# Patient Record
Sex: Male | Born: 1970 | Race: White | Hispanic: No | Marital: Single | Smoking: Never smoker
Health system: Southern US, Community
[De-identification: ages and names within clinical notes are randomized; demographics above are authoritative.]

## PROBLEM LIST (undated history)

## (undated) DIAGNOSIS — F329 Major depressive disorder, single episode, unspecified: Secondary | ICD-10-CM

## (undated) DIAGNOSIS — F419 Anxiety disorder, unspecified: Secondary | ICD-10-CM

## (undated) DIAGNOSIS — F32A Depression, unspecified: Secondary | ICD-10-CM

## (undated) DIAGNOSIS — F909 Attention-deficit hyperactivity disorder, unspecified type: Secondary | ICD-10-CM

---

## 2016-02-21 ENCOUNTER — Emergency Department (HOSPITAL_COMMUNITY)
Admission: EM | Admit: 2016-02-21 | Discharge: 2016-02-21 | Disposition: A | Discharge status: Home / Self Care | Payer: Medicaid Other | Attending: Emergency Medicine | Admitting: Emergency Medicine

## 2016-02-21 ENCOUNTER — Encounter (HOSPITAL_COMMUNITY): Payer: Self-pay | Admitting: Emergency Medicine

## 2016-02-21 DIAGNOSIS — L709 Acne, unspecified: Secondary | ICD-10-CM

## 2016-02-21 DIAGNOSIS — F909 Attention-deficit hyperactivity disorder, unspecified type: Secondary | ICD-10-CM | POA: Insufficient documentation

## 2016-02-21 HISTORY — DX: Anxiety disorder, unspecified: F41.9

## 2016-02-21 HISTORY — DX: Major depressive disorder, single episode, unspecified: F32.9

## 2016-02-21 HISTORY — DX: Depression, unspecified: F32.A

## 2016-02-21 HISTORY — DX: Attention-deficit hyperactivity disorder, unspecified type: F90.9

## 2016-02-21 MED ORDER — DOXYCYCLINE HYCLATE 100 MG PO CAPS: 100.0000 mg | ORAL_CAPSULE | Freq: Two times a day (BID) | ORAL | 0 refills | Status: DC

## 2016-02-21 NOTE — ED Triage Notes (Signed)
Pt here with multiple small abscess areas to face; pt sts hx of same with staph infection

## 2016-02-21 NOTE — Discharge Instructions (Signed)
Take the prescribed medication as directed. Continue keeping your face clean with warm water and gentle soap. Follow-up with your primary care doctor next month as scheduled. Return to the ED for new or worsening symptoms.

## 2016-02-21 NOTE — ED Provider Notes (Signed)
[facility]-EMERGENCY DEPT Provider Note   CSN: [id number] Arrival date & time: 02/21/16  1524  By signing my name below, I, [name], attest that this documentation has been prepared under the direction and in the presence of Sharilyn SitesLisa Avielle Imbert, PA-C. Electronically signed by: [name], ED Scribe. 02/21/16. 5:12 PM.    History   Chief Complaint Chief Complaint  Patient presents with  . Abscess    The history is provided by the patient and medical records. No language interpreter was used.   HPI Comments:  Tyler Trevino is a 45 y.o. male with a history of staph infection who presents to the Emergency Department complaining of multiple abscesses/acne, which started a weeks ago. Abscesses/acne are localized to face and back of neck.  Pt believes that it may be a staph infection again and has previously taken antibiotics, which have helped. Denies fever.  No recent sick contacts.  No hx of MRSA, HIV, or DM.  Has been using acne medication at home without relief.   Past Medical History:  Diagnosis Date  . ADHD (attention deficit hyperactivity disorder)   . Anxiety   . Depression     There are no active problems to display for this patient.   History reviewed. No pertinent surgical history.     Home Medications    Prior to Admission medications   Not on File    Family History History reviewed. No pertinent family history.  Social History Social History  Substance Use Topics  . Smoking status: Never Smoker  . Smokeless tobacco: Never Used  . Alcohol use Yes     Comment: occ     Allergies   Review of patient's allergies indicates no known allergies.   Review of Systems Review of Systems  Constitutional: Negative for fever.  Skin: Positive for wound (acne).  All other systems reviewed and are negative.    Physical Exam Updated Vital Signs BP 118/86 (BP Location: Right Arm)   Pulse 67   Temp 98 F (36.7 C) (Oral)   Resp 18   SpO2 100%   Physical Exam    Constitutional: He is oriented to person, place, and time. He appears well-developed and well-nourished.  HENT:  Head: Normocephalic and atraumatic.  Mouth/Throat: Oropharynx is clear and moist.  Scattered areas of acne across the face and posterior neck; some areas have small pustules present without active drainage; no surrounding erythema or induration; no facial or neck swelling  Eyes: Conjunctivae and EOM are normal. Pupils are equal, round, and reactive to light.  Neck: Normal range of motion.  Cardiovascular: Normal rate, regular rhythm and normal heart sounds.   Pulmonary/Chest: Effort normal and breath sounds normal.  Abdominal: Soft. Bowel sounds are normal.  Musculoskeletal: Normal range of motion.  Lymphadenopathy:    He has no cervical adenopathy.  Neurological: He is alert and oriented to person, place, and time.  Skin: Skin is warm and dry.  Psychiatric: He has a normal mood and affect.  Nursing note and vitals reviewed.    ED Treatments / Results  DIAGNOSTIC STUDIES:  Oxygen Saturation is 100% on room air, normal by my interpretation.    COORDINATION OF CARE:  5:03 PM Discussed treatment plan with pt at bedside, which includes prescription for antibiotics, and pt agreed to plan. Advised to follow up with PCP if sxs do not improve.  Labs (all labs ordered are listed, but only abnormal results are displayed) Labs Reviewed - No data to display  EKG  EKG Interpretation None       Radiology No results found.  Procedures Procedures (including critical care time)  Medications Ordered in ED Medications - No data to display   Initial Impression / Assessment and Plan / ED Course  I have reviewed the triage vital signs and the nursing notes.  Pertinent labs & imaging results that were available during my care of the patient were reviewed by me and considered in my medical decision making (see chart for details).  Clinical Course   45 year old male here  with concern of staph infection. Patient has large amount of acne scattered across his face and posterior neck. Some areas to have small pustules without active drainage. There are no signs of superimposed infection or cellulitis. Does report history of staph infection. No history of MRSA, HIV, or diabetes. Has been using home acne medication without relief. He is afebrile and nontoxic. Will start on a trial of doxycycline. He has scheduled follow-up with his primary care doctor next month for recheck.  Discussed plan with patient, he acknowledged understanding and agreed with plan of care.  Return precautions given for new or worsening symptoms.  Final Clinical Impressions(s) / ED Diagnoses   Final diagnoses:  Acne, unspecified acne type    New Prescriptions New Prescriptions   DOXYCYCLINE (VIBRAMYCIN) 100 MG CAPSULE    Take 1 capsule (100 mg total) by mouth 2 (two) times daily.   I personally performed the services described in this documentation, which was scribed in my presence. The recorded information has been reviewed and is accurate.    Garlon HatchetLisa M Tayson Schnelle, PA-C 02/21/16 1728    Derwood KaplanAnkit Nanavati, MD 02/22/16 [id number]

## 2016-02-21 NOTE — ED Notes (Signed)
Declined W/C at D/C and was escorted to lobby by RN. 

## 2016-03-13 ENCOUNTER — Encounter (HOSPITAL_COMMUNITY): Payer: Self-pay | Admitting: Emergency Medicine

## 2016-03-13 ENCOUNTER — Emergency Department (HOSPITAL_COMMUNITY)
Admission: EM | Admit: 2016-03-13 | Discharge: 2016-03-13 | Disposition: A | Discharge status: Home / Self Care | Payer: Medicaid Other | Attending: Emergency Medicine | Admitting: Emergency Medicine

## 2016-03-13 DIAGNOSIS — R21 Rash and other nonspecific skin eruption: Secondary | ICD-10-CM | POA: Diagnosis present

## 2016-03-13 DIAGNOSIS — L700 Acne vulgaris: Secondary | ICD-10-CM | POA: Diagnosis not present

## 2016-03-13 DIAGNOSIS — F909 Attention-deficit hyperactivity disorder, unspecified type: Secondary | ICD-10-CM | POA: Insufficient documentation

## 2016-03-13 MED ORDER — DOXYCYCLINE HYCLATE 100 MG PO CAPS: 100.0000 mg | ORAL_CAPSULE | Freq: Two times a day (BID) | ORAL | 0 refills | Status: DC

## 2016-03-13 NOTE — ED Provider Notes (Signed)
[facility]-EMERGENCY DEPT Provider Note   CSN: [id number] Arrival date & time: 03/13/16  1446  By signing my name below, I, Clovis Pu, attest that this documentation has been prepared under the direction and in the presence of  Fayrene Helper, PA-C. Electronically Signed: Clovis Pu, ED Scribe. 03/13/16. 5:06 PM.   History   Chief Complaint Chief Complaint  Patient presents with  . Rash    The history is provided by the patient. No language interpreter was used.   HPI Comments:  Tyler Trevino is a 45 y.o. male who presents to the Emergency Department complaining of intermittent episodes of acne-like rash appearing on his face for the last few years. Pt states the pustules would pop and become infected. He visited the ED for similar symptoms on 02/21/16 and was given antibiotics. Pt notes the antibiotics gave him relief but the symptoms have recently returned. Pt has washed his sheets and disinfected his house but this has provided no relief to his symptoms. He notes an recent elevation in stress levels. He denies fevers and hx of HIV. Pt has not taken drugs recently. DOes have hx of facial acne in the past and in his youth.    Past Medical History:  Diagnosis Date  . ADHD (attention deficit hyperactivity disorder)   . Anxiety   . Depression     There are no active problems to display for this patient.   History reviewed. No pertinent surgical history.     Home Medications    Prior to Admission medications   Medication Sig Start Date End Date Taking? Authorizing Provider  doxycycline (VIBRAMYCIN) 100 MG capsule Take 1 capsule (100 mg total) by mouth 2 (two) times daily. 02/21/16   Garlon Hatchet, PA-C    Family History History reviewed. No pertinent family history.  Social History Social History  Substance Use Topics  . Smoking status: Never Smoker  . Smokeless tobacco: Never Used  . Alcohol use Yes     Comment: occ     Allergies   Review of patient's allergies  indicates no known allergies.   Review of Systems Review of Systems  Constitutional: Negative for fever.  Skin: Positive for rash.     Physical Exam Updated Vital Signs BP 111/87 (BP Location: Left Arm)   Pulse 72   Temp 97.9 F (36.6 C) (Oral)   Resp 18   SpO2 100%   Physical Exam  Constitutional: He appears well-developed and well-nourished. No distress.  HENT:  Head: Normocephalic and atraumatic.  Neck: Neck supple.  Pulmonary/Chest: Effort normal.  Neurological: He is alert.  Skin: Rash noted. He is not diaphoretic. There is erythema.  Multiple papillar pustule lesions consitent to acne. No evidence of cellulitis. No obvious abscess noted. No oral involvement.    Nursing note and vitals reviewed.    ED Treatments / Results  DIAGNOSTIC STUDIES:  Oxygen Saturation is 100% on RA, normal by my interpretation.    COORDINATION OF CARE:  4:55 PM Discussed treatment plan with pt at bedside and pt agreed to plan.  Labs (all labs ordered are listed, but only abnormal results are displayed) Labs Reviewed - No data to display  EKG  EKG Interpretation None       Radiology No results found.  Procedures Procedures (including critical care time)  Medications Ordered in ED Medications - No data to display   Initial Impression / Assessment and Plan / ED Course  I have reviewed the triage vital signs and the  nursing notes.  Pertinent labs & imaging results that were available during my care of the patient were reviewed by me and considered in my medical decision making (see chart for details).  Clinical Course   BP 111/87 (BP Location: Left Arm)   Pulse 72   Temp 97.9 F (36.6 C) (Oral)   Resp 18   SpO2 100%    Final Clinical Impressions(s) / ED Diagnoses   Final diagnoses:  Pustular acne    New Prescriptions Current Discharge Medication List    I personally performed the services described in this documentation, which was scribed in my presence.  The recorded information has been reviewed and is accurate.   5:09 PM Pt here with recurrent facial rash consistent with pustular acne.  Will prescribe doxy as well as dermatology referral.      Fayrene HelperBowie Adwoa Axe, PA-C 03/13/16 1710    Bethann BerkshireJoseph Zammit, MD 03/13/16 2319

## 2016-03-13 NOTE — ED Triage Notes (Signed)
Pt states that he has had acne-appearing rash on his face off and on for years that has worsened recently. Hx of staph infection. Cleared up with antibiotics before but has returned. Alert and oriented.

## 2016-03-27 ENCOUNTER — Ambulatory Visit: Payer: Self-pay | Admitting: Family Medicine

## 2016-05-12 ENCOUNTER — Emergency Department (HOSPITAL_COMMUNITY): Payer: Medicaid Other

## 2016-05-12 ENCOUNTER — Emergency Department (HOSPITAL_COMMUNITY)
Admission: EM | Admit: 2016-05-12 | Discharge: 2016-05-12 | Disposition: A | Discharge status: Home / Self Care | Payer: Medicaid Other | Attending: Emergency Medicine | Admitting: Emergency Medicine

## 2016-05-12 ENCOUNTER — Encounter (HOSPITAL_COMMUNITY): Payer: Self-pay | Admitting: Emergency Medicine

## 2016-05-12 DIAGNOSIS — Y929 Unspecified place or not applicable: Secondary | ICD-10-CM | POA: Insufficient documentation

## 2016-05-12 DIAGNOSIS — Y999 Unspecified external cause status: Secondary | ICD-10-CM | POA: Insufficient documentation

## 2016-05-12 DIAGNOSIS — F909 Attention-deficit hyperactivity disorder, unspecified type: Secondary | ICD-10-CM | POA: Insufficient documentation

## 2016-05-12 DIAGNOSIS — Y939 Activity, unspecified: Secondary | ICD-10-CM | POA: Diagnosis not present

## 2016-05-12 DIAGNOSIS — S59902A Unspecified injury of left elbow, initial encounter: Secondary | ICD-10-CM | POA: Diagnosis present

## 2016-05-12 DIAGNOSIS — S5002XA Contusion of left elbow, initial encounter: Secondary | ICD-10-CM | POA: Diagnosis not present

## 2016-05-12 DIAGNOSIS — W010XXA Fall on same level from slipping, tripping and stumbling without subsequent striking against object, initial encounter: Secondary | ICD-10-CM | POA: Diagnosis not present

## 2016-05-12 MED ORDER — NAPROXEN 500 MG PO TABS: 500.0000 mg | ORAL_TABLET | Freq: Two times a day (BID) | ORAL | 0 refills | Status: DC

## 2016-05-12 NOTE — ED Provider Notes (Signed)
[facility]-EMERGENCY DEPT Provider Note   CSN: [id number] Arrival date & time: 05/12/16  1258   By signing my name below, I, Avnee Patel, attest that this documentation has been prepared under the direction and in the presence of  Cheri FowlerKayla Kenyada Dosch, PA-C. Electronically Signed: Clovis PuAvnee Patel, ED Scribe. 05/12/16. 3:29 PM.   History   Chief Complaint Chief Complaint  Patient presents with  . Arm Injury   The history is provided by the patient. No language interpreter was used.   HPI Comments:  Tyler Trevino is a 45 y.o. male who presents to the Emergency Department complaining of gradual onset, moderate left elbow pain s/p mechanical fall which occurred last night. Pt states he tripped and fell onto his elbow and notes he is not able to fully move arm at this joint. He denies shoulder pain, wrist pain and any other symptoms at this time. No head injury or LOC.  No alleviating factors noted.   Past Medical History:  Diagnosis Date  . ADHD (attention deficit hyperactivity disorder)   . Anxiety   . Depression     There are no active problems to display for this patient.   No past surgical history on file.  Home Medications    Prior to Admission medications   Medication Sig Start Date End Date Taking? Authorizing Provider  doxycycline (VIBRAMYCIN) 100 MG capsule Take 1 capsule (100 mg total) by mouth 2 (two) times daily. 03/13/16   Fayrene HelperBowie Tran, PA-C  naproxen (NAPROSYN) 500 MG tablet Take 1 tablet (500 mg total) by mouth 2 (two) times daily. 05/12/16   Cheri FowlerKayla Mila Pair, PA-C    Family History No family history on file.  Social History Social History  Substance Use Topics  . Smoking status: Never Smoker  . Smokeless tobacco: Never Used  . Alcohol use Yes     Comment: occ     Allergies   Patient has no known allergies.   Review of Systems Review of Systems  Musculoskeletal: Positive for arthralgias.  Neurological: Negative for numbness.  All other systems reviewed and are  negative.   Physical Exam Updated Vital Signs BP 128/85 (BP Location: Right Arm)   Pulse 100   Temp 98.5 F (36.9 C) (Oral)   Resp 16   SpO2 100%   Physical Exam  Constitutional: He is oriented to person, place, and time. He appears well-developed and well-nourished. No distress.  HENT:  Head: Normocephalic and atraumatic.  Eyes: Conjunctivae are normal.  Cardiovascular: Normal rate.   Pulses:      Radial pulses are 2+ on the right side, and 2+ on the left side.  Pulmonary/Chest: Effort normal.  Abdominal: He exhibits no distension.  Musculoskeletal: He exhibits tenderness.  Left elbow without swelling, bruising, or wound. Normal ROM with flexion and extension. Tenderness over olecranon. Compartment soft and compressible.  Neurological: He is alert and oriented to person, place, and time.  Strength and sensations intact throughout BUE.   Skin: Skin is warm and dry.  Psychiatric: He has a normal mood and affect.  Nursing note and vitals reviewed.   ED Treatments / Results  DIAGNOSTIC STUDIES:  Oxygen Saturation is 100% on RA, normal by my interpretation.    COORDINATION OF CARE:  3:24 PM Discussed treatment plan with pt at bedside and pt agreed to plan.  Labs (all labs ordered are listed, but only abnormal results are displayed) Labs Reviewed - No data to display  EKG  EKG Interpretation None  Radiology Dg Elbow Complete Left  Result Date: 05/12/2016 CLINICAL DATA:  Pain after trauma EXAM: LEFT ELBOW - COMPLETE 3+ VIEW COMPARISON:  None. FINDINGS: There is no evidence of fracture, dislocation, or joint effusion. There is no evidence of arthropathy or other focal bone abnormality. Soft tissues are unremarkable. IMPRESSION: Negative. Electronically Signed   By: Gerome Samavid  Williams III M.D   On: 05/12/2016 13:48    Procedures Procedures (including critical care time)  Medications Ordered in ED Medications - No data to display   Initial Impression /  Assessment and Plan / ED Course  I have reviewed the triage vital signs and the nursing notes.  Pertinent labs & imaging results that were available during my care of the patient were reviewed by me and considered in my medical decision making (see chart for details).  Clinical Course    Patient X-Ray negative for obvious fracture or dislocation.  Pt advised to follow up with orthopedics if pain persists. Conservative therapy recommended and discussed. Patient will be discharged home & is agreeable with above plan. Returns precautions discussed. Pt appears safe for discharge.  Final Clinical Impressions(s) / ED Diagnoses   Final diagnoses:  Contusion of left elbow, initial encounter    New Prescriptions Discharge Medication List as of 05/12/2016  3:26 PM    START taking these medications   Details  naproxen (NAPROSYN) 500 MG tablet Take 1 tablet (500 mg total) by mouth 2 (two) times daily., Starting Sat 05/12/2016, Print       I personally performed the services described in this documentation, which was scribed in my presence. The recorded information has been reviewed and is accurate.     Cheri FowlerKayla Iyla Balzarini, PA-C 05/12/16 2200    Melene Planan Floyd, DO 05/13/16 1242

## 2016-05-12 NOTE — Discharge Instructions (Signed)
Start taking Naprosyn twice daily as needed for pain. Apply ice to her elbow for 10-20 minutes 3 times daily. Continue performing range of motion exercises to avoid stiffness. Follow-up with orthopedics in 1-2 weeks for persistent symptoms. Return to the emergency department for sudden severe swelling, numbness, changes in color, or any new or concerning symptoms.

## 2016-05-12 NOTE — ED Triage Notes (Signed)
Per pt, states he tripped and fell onto left elbow last night-now unable to fully move left arm at elbow

## 2017-01-10 ENCOUNTER — Emergency Department (HOSPITAL_COMMUNITY)
Admission: EM | Admit: 2017-01-10 | Discharge: 2017-01-10 | Disposition: A | Discharge status: Home / Self Care | Payer: Medicaid Other | Attending: Emergency Medicine | Admitting: Emergency Medicine

## 2017-01-10 ENCOUNTER — Emergency Department (HOSPITAL_COMMUNITY): Payer: Medicaid Other

## 2017-01-10 ENCOUNTER — Encounter (HOSPITAL_COMMUNITY): Payer: Self-pay | Admitting: Emergency Medicine

## 2017-01-10 DIAGNOSIS — R0789 Other chest pain: Secondary | ICD-10-CM | POA: Insufficient documentation

## 2017-01-10 DIAGNOSIS — R1031 Right lower quadrant pain: Secondary | ICD-10-CM | POA: Diagnosis present

## 2017-01-10 DIAGNOSIS — R109 Unspecified abdominal pain: Secondary | ICD-10-CM

## 2017-01-10 LAB — I-STAT TROPONIN, ED: TROPONIN I, POC: 0.01 ng/mL (ref 0.00–0.08)

## 2017-01-10 LAB — CBC WITH DIFFERENTIAL/PLATELET
BASOS ABS: 0 10*3/uL (ref 0.0–0.1)
BASOS PCT: 0 %
EOS ABS: 0.1 10*3/uL (ref 0.0–0.7)
EOS PCT: 3 %
HCT: 40.7 % (ref 39.0–52.0)
Hemoglobin: 14.4 g/dL (ref 13.0–17.0)
Lymphocytes Relative: 25 %
Lymphs Abs: 1.1 10*3/uL (ref 0.7–4.0)
MCH: 31.5 pg (ref 26.0–34.0)
MCHC: 35.4 g/dL (ref 30.0–36.0)
MCV: 89.1 fL (ref 78.0–100.0)
MONO ABS: 0.5 10*3/uL (ref 0.1–1.0)
Monocytes Relative: 10 %
Neutro Abs: 2.8 10*3/uL (ref 1.7–7.7)
Neutrophils Relative %: 62 %
Platelets: 321 10*3/uL (ref 150–400)
RBC: 4.57 MIL/uL (ref 4.22–5.81)
RDW: 13.2 % (ref 11.5–15.5)
WBC: 4.4 10*3/uL (ref 4.0–10.5)

## 2017-01-10 LAB — COMPREHENSIVE METABOLIC PANEL
ALK PHOS: 52 U/L (ref 38–126)
ALT: 8 U/L — ABNORMAL LOW (ref 17–63)
ANION GAP: 8 (ref 5–15)
AST: 21 U/L (ref 15–41)
Albumin: 4.2 g/dL (ref 3.5–5.0)
BILIRUBIN TOTAL: 0.8 mg/dL (ref 0.3–1.2)
BUN: 10 mg/dL (ref 6–20)
CALCIUM: 9.5 mg/dL (ref 8.9–10.3)
CO2: 30 mmol/L (ref 22–32)
Chloride: 99 mmol/L — ABNORMAL LOW (ref 101–111)
Creatinine, Ser: 0.72 mg/dL (ref 0.61–1.24)
GFR calc non Af Amer: >60 mL/min (ref >60)
GLUCOSE: 98 mg/dL (ref 65–99)
Potassium: 3.8 mmol/L (ref 3.5–5.1)
Sodium: 137 mmol/L (ref 135–145)
TOTAL PROTEIN: 7.4 g/dL (ref 6.5–8.1)

## 2017-01-10 LAB — URINALYSIS, ROUTINE W REFLEX MICROSCOPIC
Bilirubin Urine: NEGATIVE
GLUCOSE, UA: NEGATIVE mg/dL
HGB URINE DIPSTICK: NEGATIVE
Ketones, ur: NEGATIVE mg/dL
Leukocytes, UA: NEGATIVE
Nitrite: NEGATIVE
PH: 8 (ref 5.0–8.0)
PROTEIN: NEGATIVE mg/dL
Specific Gravity, Urine: 1.003 — ABNORMAL LOW (ref 1.005–1.030)

## 2017-01-10 LAB — ETHANOL: Alcohol, Ethyl (B): <5 mg/dL (ref <5)

## 2017-01-10 LAB — LIPASE, BLOOD: Lipase: 16 U/L (ref 11–51)

## 2017-01-10 NOTE — ED Provider Notes (Signed)
[facility]-EMER[facility] DEPT Provider Note   CSN: [id number] Arrival date & time: 01/10/17  1826     History   Chief Complaint Chief Complaint  Patient presents with  . Abdominal Pain    HPI Tyler Trevino is a 46 y.o. male.  The history is provided by the patient and medical records.  Abdominal Pain      46 year old male with history of ADHD, anxiety, depression, presenting to the ED with abdominal pain.  Patient reports this afternoon he had sudden onset of right flank pain. States pain is sharp and stabbing. States he feels like something inside is going to burst. Reports he has had frequent urination, but does drink large amounts of water. He has not noticed any hematuria. No nausea or vomiting. No fever or chills.  Patient does admit he has been drinking heavily for the past week. He has been drinking partially 12 beers nightly, last drink was around 10:30 PM yesterday.  States he does not feel he has a problem with alcohol, however he has been vacationing with some friends and when return home he was having trouble dealing with some new changes in his life and was using alcohol to cope.  Reports he occasionally smokes marijuana, none recently.  States he has had a little bit of chest tightness but thinks it is due to his anxiety. States he gets very concerned and overwhelmed in regards to his health. No SOB, cough.   He has no known cardiac history. Reports his grandfather died from cardiac complications as well as Doreatha Martin disease. He is not a cigarette smoker.  Past Medical History:  Diagnosis Date  . ADHD (attention deficit hyperactivity disorder)   . Anxiety   . Depression     There are no active problems to display for this patient.   History reviewed. No pertinent surgical history.     Home Medications    Prior to Admission medications   Medication Sig Start Date End Date Taking? Authorizing Provider  doxycycline (VIBRAMYCIN) 100 MG capsule Take 1 capsule (100 mg  total) by mouth 2 (two) times daily. 03/13/16   Fayrene Helper, PA-C  naproxen (NAPROSYN) 500 MG tablet Take 1 tablet (500 mg total) by mouth 2 (two) times daily. 05/12/16   Cheri Fowler, PA-C    Family History History reviewed. No pertinent family history.  Social History Social History  Substance Use Topics  . Smoking status: Never Smoker  . Smokeless tobacco: Never Used  . Alcohol use Yes     Comment: occ     Allergies   Patient has no known allergies.   Review of Systems Review of Systems  Respiratory: Positive for chest tightness.   Gastrointestinal: Positive for abdominal pain.  All other systems reviewed and are negative.    Physical Exam Updated Vital Signs BP (!) 118/95 (BP Location: Right Arm)   Pulse 86   Temp 97.7 F (36.5 C) (Oral)   Resp 18   SpO2 100%   Physical Exam  Constitutional: He is oriented to person, place, and time. He appears well-developed and well-nourished.  HENT:  Head: Normocephalic and atraumatic.  Mouth/Throat: Oropharynx is clear and moist.  Eyes: Pupils are equal, round, and reactive to light. Conjunctivae and EOM are normal.  Neck: Normal range of motion.  Cardiovascular: Normal rate, regular rhythm and normal heart sounds.   Pulmonary/Chest: Effort normal and breath sounds normal.  Abdominal: Soft. Bowel sounds are normal.  Musculoskeletal: Normal range of motion.  Neurological: He  is alert and oriented to person, place, and time.  Skin: Skin is warm and dry.  Psychiatric: His mood appears anxious.  Somewhat anxious appearing, limited eye contact, rapid speech  Nursing note and vitals reviewed.    ED Treatments / Results  Labs (all labs ordered are listed, but only abnormal results are displayed) Labs Reviewed  COMPREHENSIVE METABOLIC PANEL - Abnormal; Notable for the following:       Result Value   Chloride 99 (*)    ALT 8 (*)    All other components within normal limits  URINALYSIS, ROUTINE W REFLEX MICROSCOPIC -  Abnormal; Notable for the following:    Color, Urine STRAW (*)    Specific Gravity, Urine 1.003 (*)    All other components within normal limits  CBC WITH DIFFERENTIAL/PLATELET  LIPASE, BLOOD  ETHANOL  I-STAT TROPOININ, ED    EKG  EKG Interpretation None       Radiology Dg Chest 2 View  Result Date: 01/10/2017 CLINICAL DATA:  Chest tightness, anxiety. EXAM: CHEST  2 VIEW COMPARISON:  None. FINDINGS: Cardiomediastinal silhouette is normal. No pleural effusions or focal consolidations. Hyperinflation with flattened hemidiaphragms. Trachea projects midline and there is no pneumothorax. Soft tissue planes and included osseous structures are non-suspicious. IMPRESSION: Hyperinflation without focal consolidation. Electronically Signed   By: Awilda Metroourtnay  Bloomer M.D.   On: 01/10/2017 20:37   Ct Renal Stone Study  Result Date: 01/10/2017 CLINICAL DATA:  Acute onset right lower quadrant pain today. EXAM: CT ABDOMEN AND PELVIS WITHOUT CONTRAST TECHNIQUE: Multidetector CT imaging of the abdomen and pelvis was performed following the standard protocol without IV contrast. COMPARISON:  None. FINDINGS: Lower chest: No acute findings. Hepatobiliary: No masses visualized on this unenhanced exam. Gallbladder is unremarkable. Pancreas: No mass or inflammatory process visualized on this unenhanced exam. Spleen:  Within normal limits in size. Adrenals/Urinary tract: No evidence of urolithiasis or hydronephrosis. Mild diffuse bladder wall thickening, suspicious for cystitis. Stomach/Bowel: No evidence of obstruction, inflammatory process, or abnormal fluid collections. Mild colonic diverticulosis, without radiographic evidence of diverticulitis. Vascular/Lymphatic: No pathologically enlarged lymph nodes identified. No evidence of abdominal aortic aneurysm. Reproductive:  No mass or other significant abnormality. Other:  None. Musculoskeletal:  No suspicious bone lesions identified. IMPRESSION: No evidence of  urolithiasis or hydronephrosis. Mild diffuse bladder wall thickening, suspicious for cystitis. Recommend correlation with urinalysis, and consider cystoscopy for further evaluation if warranted. Colonic diverticulosis, without radiographic evidence of diverticulitis. Electronically Signed   By: Myles RosenthalJohn  Stahl M.D.   On: 01/10/2017 19:56    Procedures Procedures (including critical care time)  Medications Ordered in ED Medications - No data to display   Initial Impression / Assessment and Plan / ED Course  I have reviewed the triage vital signs and the nursing notes.  Pertinent labs & imaging results that were available during my care of the patient were reviewed by me and considered in my medical decision making (see chart for details).  46 year old male here with right flank pain.  No associated urinary symptoms. Reports some chest tightness, but feels it was more anxiety related. No known cardiac history. EKG sinus rhythm with findings of early repolarization.  Labs overall reassuring, trop negative.  CXR clear.  Renal study obtained-- findings of bladder wall thickening, question of cystitis.  UA without signs of infection.  Results discussed with patient, he acknowledged understanding. He is feeling better at this time without any acute intervention here.  Feel he is stable for discharge.  Will have him  follow-up with closely with PCP.  Discussed plan with patient, he acknowledged understanding and agreed with plan of care.  Return precautions given for new or worsening symptoms.  Final Clinical Impressions(s) / ED Diagnoses   Final diagnoses:  Flank pain  Chest tightness    New Prescriptions New Prescriptions   No medications on file     Oletha Blend 01/10/17 2206    Linwood Dibbles, MD 01/11/17 [phone]

## 2017-01-10 NOTE — ED Triage Notes (Signed)
Patient c/o sudden onset of RLQ pain since this afternoon. States "it feels like an organ is going to burst." Patient also c/o chest tightness related to anxiety. Reports drinking approx 12 pack of beer every night for the past 10 days.

## 2017-01-10 NOTE — ED Notes (Signed)
Pt ambulatory and independent at discharge.  Verbalized understanding of discharge instructions 

## 2017-01-10 NOTE — ED Notes (Signed)
ED Provider at bedside. 

## 2017-01-10 NOTE — Discharge Instructions (Signed)
As we discussed, your workup today was normal. I recommend that you follow-up closely with your primary care doctor for any ongoing issues. You can return here for any new or worsening symptoms or new concerns.

## 2017-10-05 ENCOUNTER — Encounter (HOSPITAL_COMMUNITY): Payer: Self-pay

## 2017-10-05 ENCOUNTER — Other Ambulatory Visit: Payer: Self-pay

## 2017-10-05 ENCOUNTER — Emergency Department (HOSPITAL_COMMUNITY)
Admission: EM | Admit: 2017-10-05 | Discharge: 2017-10-05 | Disposition: A | Discharge status: Home / Self Care | Payer: Medicaid Other | Attending: Emergency Medicine | Admitting: Emergency Medicine

## 2017-10-05 DIAGNOSIS — X58XXXA Exposure to other specified factors, initial encounter: Secondary | ICD-10-CM | POA: Insufficient documentation

## 2017-10-05 DIAGNOSIS — Y999 Unspecified external cause status: Secondary | ICD-10-CM | POA: Insufficient documentation

## 2017-10-05 DIAGNOSIS — Y9389 Activity, other specified: Secondary | ICD-10-CM | POA: Diagnosis not present

## 2017-10-05 DIAGNOSIS — Y929 Unspecified place or not applicable: Secondary | ICD-10-CM | POA: Diagnosis not present

## 2017-10-05 DIAGNOSIS — T18108A Unspecified foreign body in esophagus causing other injury, initial encounter: Secondary | ICD-10-CM | POA: Diagnosis present

## 2017-10-05 MED ORDER — SODIUM CHLORIDE 0.9 % IV SOLN: INTRAVENOUS | Status: DC

## 2017-10-05 MED ORDER — GLUCAGON HCL RDNA (DIAGNOSTIC) 1 MG IJ SOLR: 1.0000 mg | Freq: Once | INTRAMUSCULAR | Status: DC

## 2017-10-05 NOTE — ED Notes (Signed)
Provided pt Chicken Noodle Soup to drink.

## 2017-10-05 NOTE — ED Provider Notes (Signed)
[facility]-EMERGENCY DEPT Provider Note   CSN: [id number] Arrival date & time: 10/05/17  1705     History   Chief Complaint Chief Complaint  Patient presents with  . Pill stuck in throat    HPI Tyler Trevino is a 47 y.o. male.  Patient indicates feels like pill got stuck when swallowed. Was taking his two normal prescription pills approximately 45 minutes ago, and felt like it got stuck. Points to upper chest/sternal notch area as the area where it feels like pill is stuck. Has not tried to drink fluids since. Denies vomiting. When ate earlier today, it went down like normal. No hx esophageal fb. States otherwise feels fine, at baseline. No other recent/acute illness. No sob. No cough.   The history is provided by the patient.    Past Medical History:  Diagnosis Date  . ADHD (attention deficit hyperactivity disorder)   . Anxiety   . Depression     There are no active problems to display for this patient.   History reviewed. No pertinent surgical history.      Home Medications    Prior to Admission medications   Medication Sig Start Date End Date Taking? Authorizing Provider  buPROPion (WELLBUTRIN XL) 150 MG 24 hr tablet Take 150 mg by mouth daily.    [provider]  doxycycline (VIBRAMYCIN) 100 MG capsule Take 1 capsule (100 mg total) by mouth 2 (two) times daily. Patient not taking: Reported on 01/10/2017 03/13/16   Fayrene Helperran, [place], PA-C  gabapentin (NEURONTIN) 300 MG capsule Take 300 mg by mouth 3 (three) times daily.    [provider]  lisdexamfetamine (VYVANSE) 70 MG capsule Take 70 mg by mouth daily.    [provider]  naproxen (NAPROSYN) 500 MG tablet Take 1 tablet (500 mg total) by mouth 2 (two) times daily. Patient not taking: Reported on 01/10/2017 05/12/16   Cheri Fowlerose, Kayla, PA-C  propranolol (INDERAL) 10 MG tablet Take 10 mg by mouth daily as needed (anxiety).    [provider]    Family  History History reviewed. No pertinent family history.  Social History Social History   Tobacco Use  . Smoking status: Never Smoker  . Smokeless tobacco: Never Used  Substance Use Topics  . Alcohol use: Yes    Comment: occ  . Drug use: No     Allergies   Patient has no known allergies.   Review of Systems Review of Systems  Constitutional: Negative for fever.  HENT: Negative for sore throat.   Eyes: Negative for redness.  Respiratory: Negative for cough and shortness of breath.   Cardiovascular: Negative for chest pain.  Gastrointestinal: Negative for vomiting.  Genitourinary: Negative for flank pain.  Musculoskeletal: Negative for back pain.  Skin: Negative for rash.  Neurological: Negative for headaches.  Hematological: Does not bruise/bleed easily.  Psychiatric/Behavioral: Negative for confusion.     Physical Exam Updated Vital Signs BP (!) 149/106 (BP Location: Right Arm) Comment: Simultaneous filing. User may not have seen previous data.  Pulse 94 Comment: Simultaneous filing. User may not have seen previous data.  Temp 98.1 F (36.7 C) (Oral)   Resp 18   Ht 1.829 m (6')   Wt 74.8 kg (165 lb)   SpO2 100% Comment: Simultaneous filing. User may not have seen previous data.  BMI 22.38 kg/m   Physical Exam  Constitutional: He appears well-developed and well-nourished. No distress.  HENT:  Mouth/Throat: Oropharynx is clear and moist.  Eyes: Conjunctivae  are normal.  Neck: Neck supple. No tracheal deviation present. No thyromegaly present.  No mass felt.  Cardiovascular: Normal rate, regular rhythm, normal heart sounds and intact distal pulses.  Pulmonary/Chest: Effort normal and breath sounds normal. No accessory muscle usage. No respiratory distress.  No stridor.   Abdominal: Soft. He exhibits no distension. There is no tenderness.  Musculoskeletal: He exhibits no edema.  Neurological: He is alert.  Skin: Skin is warm and dry. He is not diaphoretic.   Psychiatric: He has a normal mood and affect.  Nursing note and vitals reviewed.    ED Treatments / Results  Labs (all labs ordered are listed, but only abnormal results are displayed) Labs Reviewed - No data to display  EKG None  Radiology No results found.  Procedures Procedures (including critical care time)  Medications Ordered in ED Medications - No data to display   Initial Impression / Assessment and Plan / ED Course  I have reviewed the triage vital signs and the nursing notes.  Pertinent labs & imaging results that were available during my care of the patient were reviewed by me and considered in my medical decision making (see chart for details).  Trial of po fluids.  Reviewed nursing notes and prior charts for additional history.   Initially no change w po fluids, although drinks without difficulty.   Ordered iv/glucagon. Pt refuses. Also discussed calling in gi to see/scope - pt declines that as well.  Pt ate and drank additionally fluids/snack - pt requests d/c, he states he feels it passed. No cough or trouble breathing. Pt denies continued fb sensation.     Final Clinical Impressions(s) / ED Diagnoses   Final diagnoses:  None    ED Discharge Orders    None       Cathren Laine, MD 10/05/17 1900

## 2017-10-05 NOTE — ED Notes (Signed)
Pt has expressed that pt is ready to leave.

## 2017-10-05 NOTE — ED Triage Notes (Addendum)
Patient reports that he took his Wellbutrin and Neurontin 15-20 minutes ago and feels like it is stuck in his throat. Patient talking, but has hoarseness. Sats 100%. Patient states he feels like he can not get a deep breath. Patient is very anxious.

## 2017-10-05 NOTE — Discharge Instructions (Addendum)
It was our pleasure to provide your ER care today - we hope that you feel better.  Return if symptoms recur or persist, trouble breathing or swallowing, or other concern.

## 2017-10-05 NOTE — ED Notes (Signed)
Pt does not want RN to put IV in pt. Pt expressed doubts about medication and IV. Pt stated he wanted to talk to MD a second time. I have informed the ED Provider.

## 2017-10-05 NOTE — ED Notes (Signed)
Pt does not feel like the Soda worked, pt did make remark about going home and letting it pass. I instructed pt to let me inform the ED Provider of pt concerns and let pt make decision after speaking with ED provider.

## 2018-01-13 ENCOUNTER — Encounter (HOSPITAL_COMMUNITY): Payer: Self-pay | Admitting: *Deleted

## 2018-01-13 ENCOUNTER — Emergency Department (HOSPITAL_COMMUNITY): Payer: Medicaid Other

## 2018-01-13 ENCOUNTER — Emergency Department (HOSPITAL_COMMUNITY)
Admission: EM | Admit: 2018-01-13 | Discharge: 2018-01-13 | Disposition: A | Discharge status: Home / Self Care | Payer: Medicaid Other | Attending: Emergency Medicine | Admitting: Emergency Medicine

## 2018-01-13 ENCOUNTER — Other Ambulatory Visit: Payer: Self-pay

## 2018-01-13 DIAGNOSIS — W268XXA Contact with other sharp object(s), not elsewhere classified, initial encounter: Secondary | ICD-10-CM | POA: Diagnosis not present

## 2018-01-13 DIAGNOSIS — Z23 Encounter for immunization: Secondary | ICD-10-CM | POA: Diagnosis not present

## 2018-01-13 DIAGNOSIS — Y9301 Activity, walking, marching and hiking: Secondary | ICD-10-CM | POA: Insufficient documentation

## 2018-01-13 DIAGNOSIS — Y92009 Unspecified place in unspecified non-institutional (private) residence as the place of occurrence of the external cause: Secondary | ICD-10-CM | POA: Diagnosis not present

## 2018-01-13 DIAGNOSIS — S90852A Superficial foreign body, left foot, initial encounter: Secondary | ICD-10-CM

## 2018-01-13 DIAGNOSIS — L84 Corns and callosities: Secondary | ICD-10-CM | POA: Insufficient documentation

## 2018-01-13 DIAGNOSIS — S99922A Unspecified injury of left foot, initial encounter: Secondary | ICD-10-CM | POA: Diagnosis present

## 2018-01-13 DIAGNOSIS — Z79899 Other long term (current) drug therapy: Secondary | ICD-10-CM | POA: Diagnosis not present

## 2018-01-13 DIAGNOSIS — Y998 Other external cause status: Secondary | ICD-10-CM | POA: Diagnosis not present

## 2018-01-13 MED ORDER — TETANUS-DIPHTH-ACELL PERTUSSIS 5-2.5-18.5 LF-MCG/0.5 IM SUSP
0.5000 mL | Freq: Once | INTRAMUSCULAR | Status: AC
  Administered 2018-01-13: 0.5 mL via INTRAMUSCULAR
  Filled 2018-01-13: qty 0.5

## 2018-01-13 MED ORDER — BACITRACIN ZINC 500 UNIT/GM EX OINT
TOPICAL_OINTMENT | Freq: Two times a day (BID) | CUTANEOUS | Status: DC
  Administered 2018-01-13: 1 via TOPICAL
  Filled 2018-01-13: qty 0.9

## 2018-01-13 NOTE — ED Notes (Signed)
Bacitracin dressing applied to foot

## 2018-01-13 NOTE — Discharge Instructions (Signed)
Keep wound clean with mild soap and water. Keep area covered with a topical antibiotic ointment and bandage, keep bandage dry, and do not submerge in water for 24 hours. You may want to consider using over the counter callus protectors to the area to help protect it from continuing to form a callus. Ice and elevate for additional pain and swelling relief. Alternate between Ibuprofen and Tylenol for additional pain relief. Follow up with the podiatrist listed above in approximately 3-5 days for wound recheck and ongoing management of your foot issues. Monitor area for signs of infection to include, but not limited to: increasing pain, spreading redness, drainage/pus, worsening swelling, or fevers; if this occurs, start taking the antibiotic you were prescribed at your doctor's office visit last week. Return to emergency department for emergent changing or worsening symptoms.

## 2018-01-13 NOTE — ED Notes (Signed)
Foot soaking in NS at present.

## 2018-01-13 NOTE — ED Triage Notes (Signed)
Pt has a splinter in ball of left foot, he has tried to remove it unsuccessfully.

## 2018-01-13 NOTE — ED Provider Notes (Signed)
[facility]-EMERGENCY DEPT Provider Note   CSN: [id number] Arrival date & time: 01/13/18  1354     History   Chief Complaint Chief Complaint  Patient presents with  . Foot Injury    HPI Tyler Trevino is a 47 y.o. male with a PMHx of ADHD, anxiety, and depression, who presents to the ED with complaints of possible splinter in his left foot for the last 2 weeks.  Patient states that he lives in an old house with wood floors, has a callus on his left foot so he will occasionally get splinters in his feet.  He is usually able to remove them, however this when he was not able to remove.  He has tried soaking in salt water to try to remove the splinter but has been unsuccessful.  He went to his PCP last week who prescribed him doxycycline, did not remove the splinter, but told him that if it did not come out on its own then to go to the ER for further evaluation.  He states that since it did not come out, he came here.  He has not taken the doxycycline yet.  He has minimal pain only with weightbearing, describing it as 4/10 intermittent sharp nonradiating left foot pain that worsens with weightbearing and with no treatments tried prior to arrival.  He is unsure of his last tetanus shot.  He denies any erythema, warmth, swelling, drainage, or any other signs of infection around the wound.  He denies any fevers, chills, numbness, tingling, focal weakness, or any other complaints at this time.  He does not have an orthopedist or podiatrist that he sees.  The history is provided by the patient and medical records. No language interpreter was used.  Foot Injury   Pertinent negatives include no numbness.    Past Medical History:  Diagnosis Date  . ADHD (attention deficit hyperactivity disorder)   . Anxiety   . Depression     There are no active problems to display for this patient.   History reviewed. No pertinent surgical history.      Home Medications    Prior  to Admission medications   Medication Sig Start Date End Date Taking? Authorizing Provider  buPROPion (WELLBUTRIN XL) 150 MG 24 hr tablet Take 150 mg by mouth daily.    [provider]  doxycycline (VIBRAMYCIN) 100 MG capsule Take 1 capsule (100 mg total) by mouth 2 (two) times daily. Patient not taking: Reported on 01/10/2017 03/13/16   Fayrene Helper, PA-C  gabapentin (NEURONTIN) 300 MG capsule Take 300 mg by mouth 3 (three) times daily.    [provider]  lisdexamfetamine (VYVANSE) 70 MG capsule Take 70 mg by mouth daily.    [provider]  naproxen (NAPROSYN) 500 MG tablet Take 1 tablet (500 mg total) by mouth 2 (two) times daily. Patient not taking: Reported on 01/10/2017 05/12/16   Cheri Fowler, PA-C  propranolol (INDERAL) 10 MG tablet Take 10 mg by mouth daily as needed (anxiety).    [provider]    Family History No family history on file.  Social History Social History   Tobacco Use  . Smoking status: Never Smoker  . Smokeless tobacco: Never Used  Substance Use Topics  . Alcohol use: Yes    Comment: occ  . Drug use: No     Allergies   Patient has no known allergies.   Review of Systems Review of Systems  Constitutional: Negative for chills and  fever.  Musculoskeletal: Positive for myalgias. Negative for arthralgias and joint swelling.  Skin: Positive for wound. Negative for color change.  Allergic/Immunologic: Negative for immunocompromised state.  Neurological: Negative for weakness and numbness.  Psychiatric/Behavioral: Negative for confusion.     Physical Exam Updated Vital Signs BP (!) 133/92   Pulse 91   Temp 98.7 F (37.1 C) (Oral)   Resp 16   Ht 6' (1.829 m)   Wt 72.6 kg (160 lb)   SpO2 100%   BMI 21.70 kg/m   Physical Exam  Constitutional: He is oriented to person, place, and time. Vital signs are normal. He appears well-developed and well-nourished.  Non-toxic appearance. No distress.  Afebrile, nontoxic,  NAD  HENT:  Head: Normocephalic and atraumatic.  Mouth/Throat: Mucous membranes are normal.  Eyes: Conjunctivae and EOM are normal. Right eye exhibits no discharge. Left eye exhibits no discharge.  Neck: Normal range of motion. Neck supple.  Cardiovascular: Normal rate and intact distal pulses.  Pulmonary/Chest: Effort normal. No respiratory distress.  Abdominal: Normal appearance. He exhibits no distension.  Musculoskeletal: Normal range of motion.       Left foot: There is laceration (wound). There is normal range of motion, no tenderness, no bony tenderness, no swelling, no crepitus and no deformity.       Feet:  L foot with FROM intact in all digits, no focal bony or joint line TTP, no bruising, small circular blackened wound to middle of plantar aspect of ball of foot which appears to be dried blood, skin around this is hardened and calloused, no obvious splinter noted. After removing dried blood from the foot which is all fairly superficial, small ?splinter found superficially in the tissues (hard to tell if its a splinter vs just more dried blood), no pain during procedure, and no bleeding noted. No swelling or erythema, no warmth, no drainage from the area. No crepitus or deformity. Strength and sensation grossly intact, distal pulses intact, compartments soft.   Neurological: He is alert and oriented to person, place, and time. He has normal strength. No sensory deficit.  Skin: Skin is warm, dry and intact. No rash noted.  L foot wound/dried blood/?splinter as mentioned above  Psychiatric: He has a normal mood and affect.  Nursing note and vitals reviewed.    ED Treatments / Results  Labs (all labs ordered are listed, but only abnormal results are displayed) Labs Reviewed - No data to display  EKG None  Radiology Dg Foot Complete Left  Result Date: 01/13/2018 CLINICAL DATA:  Pain in the plantar left foot at the site of a reported splinter. EXAM: LEFT FOOT - COMPLETE 3+ VIEW  COMPARISON:  None. FINDINGS: There is soft tissue swelling with minimal subcutaneous gas in distal plantar left foot at the level of MTP joints. No radiopaque foreign body. No fracture or dislocation. No osseous erosions or periosteal reaction. No suspicious focal osseous lesions or significant arthropathy. IMPRESSION: Soft tissue swelling with minimal subcutaneous gas in the distal plantar left foot. No specific radiographic findings of osteomyelitis. No radiopaque foreign body. Electronically Signed   By: Delbert Phenix M.D.   On: 01/13/2018 16:04    Procedures .Foreign Body Removal Date/Time: 01/13/2018 2:45 PM Performed by: Rhona Raider, PA-C Authorized by: Rhona Raider, [place]  Consent: Verbal consent obtained. Risks and benefits: risks, benefits and alternatives were discussed Consent given by: patient Patient understanding: patient states understanding of the procedure being performed Patient consent: the patient's understanding of the procedure matches consent given  Patient identity confirmed: verbally with patient Body area: skin General location: lower extremity Location details: left foot  Sedation: Patient sedated: no  Patient restrained: no Patient cooperative: yes Localization method: visualized Removal mechanism: forceps Dressing: antibiotic ointment and dressing applied Tendon involvement: none Depth: subcutaneous Complexity: simple 1 objects recovered. Objects recovered: ?splinter and a plug of dried blood Post-procedure assessment: foreign body removed Patient tolerance: Patient tolerated the procedure well with no immediate complications   (including critical care time)  Medications Ordered in ED Medications  Tdap (BOOSTRIX) injection 0.5 mL (0.5 mLs Intramuscular Given 01/13/18 1454)     Initial Impression / Assessment and Plan / ED Course  I have reviewed the triage vital signs and the nursing notes.  Pertinent labs & imaging results that were  available during my care of the patient were reviewed by me and considered in my medical decision making (see chart for details).     47 y.o. male here with suspected splinter in ball of L foot x2wks. On exam, small blackened wound to middle of plantar aspect of ball of L foot which appears to be dried blood, skin around this is hardened and calloused, no obvious splinter noted. After removing dried blood, small ?splinter found superficially in the tissues (hard to tell if its a splinter vs just more dried blood). No surrounding evidence of infection, NVI with soft compartments. Will soak foot, get xray to ensure no other retained FBs, and reassess. Will update Tdap as well.   4:37 PM Xray showing soft tissue swelling and subQ gas in the location where I have removed the dried blood and there is a callus; no osteomyelitis or radioopaque foreign bodies seen on xray. I suspect what the xray is showing is the small callus and hole in the center that was left after removing the dried blood and possible splinter; no signs of infection exist on exam. After soaking foot, no further embedded FBs noted, just calloused skin which was somewhat debrided and wound was explored fully, no further concerning findings on exam. Advised use of callus protectors, good shoes, tylenol/motrin/ice for pain, and discussed proper wound care. Advised f/up with podiatry in 3-5 days for wound check and ongoing management of his calluses. Doubt need for abx at this time but advised that if s/sx of infection start, then he should start taking the abx he was given last week at his PCPs office. I explained the diagnosis and have given explicit precautions to return to the ER including for any other new or worsening symptoms. The patient understands and accepts the medical plan as it's been dictated and I have answered their questions. Discharge instructions concerning home care and prescriptions have been given. The patient is STABLE and is  discharged to home in good condition.    Final Clinical Impressions(s) / ED Diagnoses   Final diagnoses:  Splinter of left foot without infection, initial encounter  Callus of foot    ED Discharge Orders    [address]       Joretta Eads, [place], [place]PA-C 01/13/18 1637    Benjiman CorePickering, Nathan, MD 01/14/18 2146

## 2018-01-13 NOTE — ED Notes (Signed)
Updated pt on delay.

## 2018-01-31 ENCOUNTER — Ambulatory Visit: Payer: Medicaid Other | Admitting: Podiatry

## 2018-02-13 ENCOUNTER — Ambulatory Visit: Payer: Medicaid Other | Admitting: Podiatry

## 2018-02-13 ENCOUNTER — Ambulatory Visit: Payer: Self-pay

## 2018-02-13 ENCOUNTER — Ambulatory Visit (INDEPENDENT_AMBULATORY_CARE_PROVIDER_SITE_OTHER): Payer: Medicaid Other

## 2018-02-13 VITALS — BP 126/96 | HR 90

## 2018-02-13 DIAGNOSIS — M79672 Pain in left foot: Secondary | ICD-10-CM

## 2018-02-13 DIAGNOSIS — S90852A Superficial foreign body, left foot, initial encounter: Secondary | ICD-10-CM | POA: Diagnosis not present

## 2018-02-13 DIAGNOSIS — S90851A Superficial foreign body, right foot, initial encounter: Secondary | ICD-10-CM

## 2018-02-13 DIAGNOSIS — S90859A Superficial foreign body, unspecified foot, initial encounter: Secondary | ICD-10-CM

## 2018-02-13 NOTE — Progress Notes (Signed)
  Subjective:  Patient ID: Tyler Karvonenharles D Sondgeroth, male    DOB: 10/07/1970,  MRN: [id number]  Chief Complaint  Patient presents with  . Foot Injury    foreign body in foot - got a splinter in his left foot about 6 weeks ago  - thinks it's all out, but has a lot of callus build up    47 y.o. male presents with the above complaint.  Reports history of foreign body in the left foot.  States that he thinks was a wooden splinter and thinks he got it all out.  But states that the area still callused over.  Occurred 6 weeks ago.  Lives in an old house with hardwood floors.   Review of Systems: Negative except as noted in the HPI. Denies N/V/F/Ch.  Past Medical History:  Diagnosis Date  . ADHD (attention deficit hyperactivity disorder)   . Anxiety   . Depression     Current Outpatient Medications:  .  buPROPion (WELLBUTRIN XL) 150 MG 24 hr tablet, Take 150 mg by mouth 2 (two) times daily. , Disp: , Rfl:  .  gabapentin (NEURONTIN) 300 MG capsule, Take 300 mg by mouth 3 (three) times daily., Disp: , Rfl:  .  lisdexamfetamine (VYVANSE) 70 MG capsule, Take 70 mg by mouth daily., Disp: , Rfl:  .  Multiple Vitamin (MULTIVITAMIN WITH MINERALS) TABS tablet, Take 1 tablet by mouth daily., Disp: , Rfl:  .  pantoprazole (PROTONIX) 40 MG tablet, Take 40 mg by mouth daily., Disp: , Rfl:  .  propranolol (INDERAL) 10 MG tablet, Take 10 mg by mouth daily as needed (anxiety)., Disp: , Rfl:   Social History   Tobacco Use  Smoking Status Never Smoker  Smokeless Tobacco Never Used    No Known Allergies Objective:   Vitals:   02/13/18 1403  BP: (!) 126/96  Pulse: 90   There is no height or weight on file to calculate BMI. Constitutional Well developed. Well nourished.  Vascular Dorsalis pedis pulses palpable bilaterally. Posterior tibial pulses palpable bilaterally. Capillary refill normal to all digits.  No cyanosis or clubbing noted. Pedal hair growth normal.  Neurologic Normal speech. Oriented  to person, place, and time. Epicritic sensation to light touch grossly present bilaterally.  Dermatologic Nails well groomed and normal in appearance. No open wounds. Hyperkeratosis plantar left foot without any evidence of foreign body upon debridement  Orthopedic: Normal joint ROM without pain or crepitus bilaterally. No visible deformities. No bony tenderness.   Radiographs: X-rays taken reviewed no underlying foreign bodies evident Assessment:   1. Foreign body in foot, initial encounter   2. Pain of left foot    Plan:  Patient was evaluated and treated and all questions answered.  Foreign body left foot -X-rays taken -Area debrided no underlying foreign body noted -Discussed that having history of injury could lead to continued hyperkeratotic formation at the site of injury -Follow-up PRN  Return if symptoms worsen or fail to improve.

## 2018-05-08 ENCOUNTER — Encounter (HOSPITAL_COMMUNITY): Payer: Self-pay

## 2018-05-08 ENCOUNTER — Emergency Department (HOSPITAL_COMMUNITY)
Admission: EM | Admit: 2018-05-08 | Discharge: 2018-05-08 | Disposition: A | Discharge status: Home / Self Care | Payer: Medicaid Other | Attending: Emergency Medicine | Admitting: Emergency Medicine

## 2018-05-08 ENCOUNTER — Other Ambulatory Visit: Payer: Self-pay

## 2018-05-08 DIAGNOSIS — T43011A Poisoning by tricyclic antidepressants, accidental (unintentional), initial encounter: Secondary | ICD-10-CM | POA: Insufficient documentation

## 2018-05-08 DIAGNOSIS — F909 Attention-deficit hyperactivity disorder, unspecified type: Secondary | ICD-10-CM | POA: Diagnosis not present

## 2018-05-08 DIAGNOSIS — Z79899 Other long term (current) drug therapy: Secondary | ICD-10-CM | POA: Insufficient documentation

## 2018-05-08 DIAGNOSIS — T43291A Poisoning by other antidepressants, accidental (unintentional), initial encounter: Secondary | ICD-10-CM

## 2018-05-08 NOTE — ED Triage Notes (Signed)
Pt states that he may have taken 2 of his bupropion , when he is supposed to only take 1, 2x per day . Pt states that he also takes vivanz, so he is concerned about this heart rate.

## 2018-05-08 NOTE — Discharge Instructions (Addendum)
Continue taking your medications as prescribed. Return to the emergency room if you develop persistent high heart rate, seizures, or any new or concerning symptoms.

## 2018-05-08 NOTE — ED Provider Notes (Signed)
[facility]-EMERGENCY DEPT Provider Note   CSN: [id number] Arrival date & time: 05/08/18  1355     History   Chief Complaint Chief Complaint  Patient presents with  . Medication Reaction    HPI Tyler Trevino is a 47 y.o. male presenting for evaluation after taking too much bupropion.  Patient states he accidentally took 2 bupropion pills this afternoon instead of 1.  He is supposed to take 1 pill bid. This is the first time he is done this.  He got confused which pills he was taking.  It was not an attempt to hurt himself.  Patient also takes Vyvanse daily, which he took as prescribed this morning around 730.  He is concerned that his heart will start racing.  Patient took his medicine about 20 minutes prior to arrival.  He normally takes about 1 hour for it to start working.  He denies dizziness, chest pain, tremors, shortness of breath, nausea, vomiting, abdominal pain.  HPI  Past Medical History:  Diagnosis Date  . ADHD (attention deficit hyperactivity disorder)   . Anxiety   . Depression     There are no active problems to display for this patient.   History reviewed. No pertinent surgical history.      Home Medications    Prior to Admission medications   Medication Sig Start Date End Date Taking? Authorizing Provider  buPROPion (WELLBUTRIN XL) 150 MG 24 hr tablet Take 150 mg by mouth 2 (two) times daily.     [provider]  gabapentin (NEURONTIN) 300 MG capsule Take 300 mg by mouth 3 (three) times daily.    [provider]  lisdexamfetamine (VYVANSE) 70 MG capsule Take 70 mg by mouth daily.    [provider]  Multiple Vitamin (MULTIVITAMIN WITH MINERALS) TABS tablet Take 1 tablet by mouth daily.    [provider]  pantoprazole (PROTONIX) 40 MG tablet Take 40 mg by mouth daily.    [provider]  propranolol (INDERAL) 10 MG tablet Take 10 mg by mouth daily as needed (anxiety).    [provider]    Family History No family history on file.  Social History Social History   Tobacco Use  . Smoking status: Never Smoker  . Smokeless tobacco: Never Used  Substance Use Topics  . Alcohol use: Yes    Comment: occ  . Drug use: No     Allergies   Patient has no known allergies.   Review of Systems Review of Systems  Respiratory: Negative for cough, chest tightness and shortness of breath.   Cardiovascular: Negative for chest pain and palpitations.  Gastrointestinal: Negative for abdominal pain, nausea and vomiting.  Psychiatric/Behavioral: Negative for self-injury and suicidal ideas. The patient is nervous/anxious.   All other systems reviewed and are negative.    Physical Exam Updated Vital Signs BP (!) 128/99 (BP Location: Right Arm)   Pulse 71   Temp 97.7 F (36.5 C) (Oral)   Resp 16   Ht 6' (1.829 m)   Wt 72.1 kg   SpO2 100%   BMI 21.56 kg/m   Physical Exam  Constitutional: He is oriented to person, place, and time. He appears well-developed and well-nourished. No distress.  Sitting comfortably in the bed in no acute distress.  Patient appears anxious.  HENT:  Head: Normocephalic and atraumatic.  Eyes: Pupils are equal, round, and reactive to light. Conjunctivae and EOM are normal.  Neck: Normal range of motion. Neck supple.  Cardiovascular: Normal rate, regular rhythm and intact distal pulses.  No tachycardia  Pulmonary/Chest: Effort normal and breath sounds normal. No respiratory distress. He has no wheezes.  Speaking in full sentences.  Clear lung sounds in all fields.  Abdominal: Soft. He exhibits no distension and no mass. There is no tenderness. There is no guarding.  Musculoskeletal: Normal range of motion.  Neurological: He is alert and oriented to person, place, and time.  No tremors or seizure-like activity.  Skin: Skin is warm and dry. Capillary refill takes less than 2 seconds.  Psychiatric: His mood appears anxious.  Patient  appears anxious, although calmed when given reassurance.  Denies SI, HI, or AVH.  This was not an attempt to hurt himself.  Nursing note and vitals reviewed.    ED Treatments / Results  Labs (all labs ordered are listed, but only abnormal results are displayed) Labs Reviewed - No data to display  EKG None  Radiology No results found.  Procedures Procedures (including critical care time)  Medications Ordered in ED Medications - No data to display   Initial Impression / Assessment and Plan / ED Course  I have reviewed the triage vital signs and the nursing notes.  Pertinent labs & imaging results that were available during my care of the patient were reviewed by me and considered in my medical decision making (see chart for details).     Patient presenting for evaluation after taking 2 bupropion pills instead of 1.  Physical exam reassuring, he is not tachycardic.  Blood pressure mildly elevated, but this is baseline per chart review.  This was not an attempt to hurt himself, patient denies SI, HI, or AVH.  I do not believe behavior health evaluation was necessary today. Per chart review, this has not happened before.  On repeat vitals, heart rate and blood pressure remains stable.  No signs of tachycardia, hypertensive crisis, or seizures.  Discussed findings with patient, and pt appears less anxious after discussion and evaluation.  Discussed low suspicion for concerning adverse effects.  Discussed return to the emergency room if he develops persistent tachycardia/palpitations, or seizure-like activity.  Return with any new or worsening symptoms.  Continue taking medication as prescribed tomorrow morning.  Follow-up with PCP as needed.  At this time, patient appears safe for discharge.   Patient states he understands and agrees plan.   Final Clinical Impressions(s) / ED Diagnoses   Final diagnoses:  Bupropion overdose, accidental or unintentional, initial encounter    ED  Discharge Orders    None       Alveria Apley, PA-C 05/08/18 1504    Benjiman Core, MD 05/08/18 1625

## 2018-07-04 ENCOUNTER — Ambulatory Visit: Payer: Medicaid Other | Admitting: Podiatry

## 2018-07-14 ENCOUNTER — Ambulatory Visit: Payer: Medicaid Other

## 2018-07-14 ENCOUNTER — Ambulatory Visit (INDEPENDENT_AMBULATORY_CARE_PROVIDER_SITE_OTHER): Payer: Medicaid Other | Admitting: Podiatry

## 2018-07-14 DIAGNOSIS — I7300 Raynaud's syndrome without gangrene: Secondary | ICD-10-CM | POA: Diagnosis not present

## 2018-07-14 DIAGNOSIS — M79671 Pain in right foot: Secondary | ICD-10-CM

## 2018-07-14 DIAGNOSIS — L819 Disorder of pigmentation, unspecified: Secondary | ICD-10-CM

## 2018-07-14 NOTE — Progress Notes (Signed)
Subjective:   Patient ID: Benjie Karvonenharles D Drost, male   DOB: 48 y.o.   MRN: [id number]   HPI 48 year old male presents the office today for concerns of "red blotches and crusting" on the bottoms of his toes 1 through 5.  He states that at first he thought they were bug bites.  This is been on there for 1 month he states they are doing much better.  He states that he had some occasional numbness to the toes as well.  Overall the color has gotten better he reports.  No recent injury or trauma.  He states he does a lot of walking and cardio activities.   Review of Systems  All other systems reviewed and are negative.  Past Medical History:  Diagnosis Date  . ADHD (attention deficit hyperactivity disorder)   . Anxiety   . Depression     No past surgical history on file.   Current Outpatient Medications:  .  buPROPion (WELLBUTRIN XL) 150 MG 24 hr tablet, Take 150 mg by mouth 2 (two) times daily. , Disp: , Rfl:  .  gabapentin (NEURONTIN) 300 MG capsule, Take 300 mg by mouth 3 (three) times daily., Disp: , Rfl:  .  lisdexamfetamine (VYVANSE) 70 MG capsule, Take 70 mg by mouth daily., Disp: , Rfl:  .  Multiple Vitamin (MULTIVITAMIN WITH MINERALS) TABS tablet, Take 1 tablet by mouth daily., Disp: , Rfl:  .  pantoprazole (PROTONIX) 40 MG tablet, Take 40 mg by mouth daily., Disp: , Rfl:  .  propranolol (INDERAL) 10 MG tablet, Take 10 mg by mouth daily as needed (anxiety)., Disp: , Rfl:   No Known Allergies       Objective:  Physical Exam  General: AAO x3, NAD  Dermatological: There is mild purple discoloration of the distal portion of the toes but there is no open sores identified there is no drainage.  Vascular: Dorsalis Pedis artery and Posterior Tibial artery pedal pulses are 2/4 bilateral with immedate capillary fill time.  There is purple discoloration of the toes.  There is no pain with calf compression, swelling, warmth, erythema.   Neruologic: Grossly intact via light touch  bilateral.  Protective threshold with Semmes Wienstein monofilament intact to all pedal sites bilateral.   Musculoskeletal: No gross boney pedal deformities bilateral. No pain, crepitus, or limitation noted with foot and ankle range of motion bilateral. Muscular strength 5/5 in all groups tested bilateral.  Gait: Unassisted, Nonantalgic.       Assessment:   Discoloration, concern for Raynauds     Plan:  -Treatment options discussed including all alternatives, risks, and complications -Etiology of symptoms were discussed -I will order circulation test to evaluate due to the toe discoloration.  There is currently no open sores but will continue to monitor.  Encouraged to continue activity and walking.  Follow-up after arterial studies or sooner if needed.  Vivi BarrackMatthew R Rourke Mcquitty DPM

## 2018-07-15 ENCOUNTER — Telehealth: Payer: Self-pay | Admitting: *Deleted

## 2018-07-15 DIAGNOSIS — S90859A Superficial foreign body, unspecified foot, initial encounter: Secondary | ICD-10-CM

## 2018-07-15 DIAGNOSIS — I7300 Raynaud's syndrome without gangrene: Secondary | ICD-10-CM

## 2018-07-15 DIAGNOSIS — L819 Disorder of pigmentation, unspecified: Secondary | ICD-10-CM

## 2018-07-15 NOTE — Telephone Encounter (Signed)
[facility] - MEDICAID AUTHORIZED [id number] ABI WITH TBI SERVICE ORDER:  [id number], EFFECTIVE DATE 07/15/2018, END DATE 08/14/2018.

## 2018-07-15 NOTE — Telephone Encounter (Signed)
Faxed orders with authorization to [facility].

## 2018-07-15 NOTE — Telephone Encounter (Signed)
-----   Message from Vivi Barrack, DPM sent at 07/14/2018  2:37 PM EST ----- Can you please order ABI/TBI. His toes are purple and he has been getting wounds.

## 2018-07-15 NOTE — Telephone Encounter (Signed)
Faxed orders to [facility]. 

## 2018-07-25 ENCOUNTER — Ambulatory Visit (HOSPITAL_COMMUNITY): Admission: RE | Admit: 2018-07-25 | Payer: Medicaid Other | Source: Ambulatory Visit

## 2018-07-25 ENCOUNTER — Encounter (HOSPITAL_COMMUNITY): Payer: Self-pay

## 2018-08-06 ENCOUNTER — Ambulatory Visit (HOSPITAL_COMMUNITY)
Admission: RE | Admit: 2018-08-06 | Payer: Medicaid Other | Source: Ambulatory Visit | Attending: Podiatry | Admitting: Podiatry

## 2018-08-20 ENCOUNTER — Encounter (HOSPITAL_COMMUNITY): Payer: Medicaid Other

## 2018-08-22 ENCOUNTER — Ambulatory Visit (HOSPITAL_COMMUNITY)
Admission: RE | Admit: 2018-08-22 | Discharge: 2018-08-22 | Disposition: A | Discharge status: Home / Self Care | Payer: Medicaid Other | Source: Ambulatory Visit | Attending: Cardiovascular Disease | Admitting: Cardiovascular Disease

## 2018-08-22 DIAGNOSIS — I7300 Raynaud's syndrome without gangrene: Secondary | ICD-10-CM | POA: Diagnosis present

## 2018-08-22 DIAGNOSIS — L819 Disorder of pigmentation, unspecified: Secondary | ICD-10-CM

## 2018-08-26 ENCOUNTER — Telehealth: Payer: Self-pay | Admitting: *Deleted

## 2018-08-26 DIAGNOSIS — I7300 Raynaud's syndrome without gangrene: Secondary | ICD-10-CM

## 2018-08-26 DIAGNOSIS — L819 Disorder of pigmentation, unspecified: Secondary | ICD-10-CM

## 2018-08-26 NOTE — Telephone Encounter (Signed)
I informed pt of Dr. Gabriel Rung review of results and I would send the referral to Dr. Allyson Sabal.

## 2018-08-26 NOTE — Telephone Encounter (Signed)
Faxed referral, clinicals and demographics to [facility]. 

## 2018-08-26 NOTE — Telephone Encounter (Signed)
-----   Message from Vivi Barrack, DPM sent at 08/26/2018  6:50 AM EST ----- Val- please let him know that the major arteries were open however there was no blood flow into the toes. Can you please schedule a referral for Dr. Allyson Sabal? Thanks.

## 2018-08-28 ENCOUNTER — Telehealth: Payer: Self-pay | Admitting: Podiatry

## 2018-08-29 NOTE — Telephone Encounter (Signed)
I would like to speak with Dr. Ardelle Anton or his nurse about an x-ray I had taken with [facility]. It sounded really serious but they couldn't tell me what is going on and told me to contact Dr. Ardelle Anton. Please call me back tomorrow.

## 2018-08-29 NOTE — Telephone Encounter (Signed)
He didn't get x-rays done recently but maybe he was talking about his circulation. Below is what I sent you previously. Thanks.   --- Val- please let him know that the major arteries were open however there was no blood flow into the toes. Can you please schedule a referral for Dr. Allyson Sabal? Thanks.

## 2018-08-29 NOTE — Telephone Encounter (Signed)
Left message for pt to call again, that I would forward his message to Dr. Ardelle Anton, but we were not able to view the x-rays performed at [facility] at this time.

## 2018-09-01 NOTE — Telephone Encounter (Signed)
Left message informing pt, Tyler Trevino did not have recent X-rays in our clinicals, but if he was calling for the circulation test those results had been called to him on 08/26/2018, and would be discussed in more depth with Tyler Trevino on 09/09/2018, but to call me with other concerns.

## 2018-09-02 ENCOUNTER — Ambulatory Visit: Payer: Medicaid Other | Admitting: Cardiovascular Disease

## 2018-09-09 ENCOUNTER — Ambulatory Visit (INDEPENDENT_AMBULATORY_CARE_PROVIDER_SITE_OTHER): Payer: Medicaid Other | Admitting: Cardiovascular Disease

## 2018-09-09 ENCOUNTER — Ambulatory Visit: Payer: Medicaid Other | Admitting: Cardiovascular Disease

## 2018-09-09 ENCOUNTER — Encounter: Payer: Self-pay | Admitting: Cardiovascular Disease

## 2018-09-09 VITALS — BP 132/93 | HR 89 | Ht 72.0 in | Wt 163.2 lb

## 2018-09-09 DIAGNOSIS — I7300 Raynaud's syndrome without gangrene: Secondary | ICD-10-CM

## 2018-09-09 NOTE — Progress Notes (Signed)
Cardiology Office Note   Date:  09/09/2018   ID:  Tyler Trevino, DOB January 11, 1971, MRN [id number]  PCP:  Norm Salt, PA  Cardiologist:   Lorine Bears, MD   Chief Complaint  Patient presents with  . Follow-up    f/u doppler      History of Present Illness: Tyler Trevino is a 48 y.o. male who was referred by Dr. Ardelle Anton for evaluation of possible Raynaud's disease. The patient has no prior cardiac history.  He has significant mental health issues and currently on disability.  He has chronic back pain with scoliosis, attention deficit disorder and GERD.  He is not a smoker.  He drinks alcohol occasionally.  Family history is negative for premature coronary artery disease. The patient developed a rash in his feet recently and thus was referred to podiatry.  He was noted to have cold toes and thus he was referred for vascular testing which showed normal ABI bilaterally with triphasic waveforms distally.  However, toe pressures were significantly dampened.  The patient reports that the rash has resolved without intervention.  He does indicate some cold sensitivity affecting both fingers and toes but really no significant discomfort.  He has no leg claudication.  No chest pain or shortness of breath.    Past Medical History:  Diagnosis Date  . ADHD (attention deficit hyperactivity disorder)   . Anxiety   . Depression     No past surgical history on file.   Current Outpatient Medications  Medication Sig Dispense Refill  . buPROPion (WELLBUTRIN XL) 150 MG 24 hr tablet Take 150 mg by mouth 2 (two) times daily.     Marland Kitchen gabapentin (NEURONTIN) 300 MG capsule Take 300 mg by mouth 3 (three) times daily.    Marland Kitchen lisdexamfetamine (VYVANSE) 70 MG capsule Take 70 mg by mouth daily.    . Multiple Vitamin (MULTIVITAMIN WITH MINERALS) TABS tablet Take 1 tablet by mouth daily.    . pantoprazole (PROTONIX) 40 MG tablet Take 40 mg by mouth daily.    . propranolol (INDERAL) 10 MG tablet  Take 10 mg by mouth daily as needed (anxiety).     No current facility-administered medications for this visit.     Allergies:   Patient has no known allergies.    Social History:  The patient  reports that he has never smoked. He has never used smokeless tobacco. He reports current alcohol use. He reports that he does not use drugs.   Family History:  The patient's family history includes Cancer in his mother; Heart disease in his father.    ROS:  Please see the history of present illness.   Otherwise, review of systems are positive for none.   All other systems are reviewed and negative.    PHYSICAL EXAM: VS:  BP (!) 132/93   Pulse 89   Ht 6' (1.829 m)   Wt 163 lb 3.2 oz (74 kg)   BMI 22.13 kg/m  , BMI Body mass index is 22.13 kg/m. GEN: Well nourished, well developed, in no acute distress  HEENT: normal  Neck: no JVD, carotid bruits, or masses Cardiac: RRR; no murmurs, rubs, or gallops,no edema  Respiratory:  clear to auscultation bilaterally, normal work of breathing GI: soft, nontender, nondistended, + BS MS: no deformity or atrophy  Skin: warm and dry, no rash Neuro:  Strength and sensation are intact Psych: euthymic mood, full affect Vascular: Femoral pulses are normal bilaterally.  Distal pulses are normal bilaterally.  His toes are mildly bluish and cool to touch.   EKG:  EKG is ordered today. The ekg ordered today demonstrates Normal sinus rhythm with no significant ST or T wave changes.   Recent Labs: No results found for requested labs within last 8760 hours.    Lipid Panel No results found for: CHOL, TRIG, HDL, CHOLHDL, VLDL, LDLCALC, LDLDIRECT    Wt Readings from Last 3 Encounters:  09/09/18 163 lb 3.2 oz (74 kg)  05/08/18 159 lb (72.1 kg)  01/13/18 160 lb (72.6 kg)       PAD Screen 09/09/2018  Previous PAD dx? No  Previous surgical procedure? No  Pain with walking? No  Feet/toe relief with dangling? No  Painful, non-healing ulcers? No    Extremities discolored? No      ASSESSMENT AND PLAN:  1.  Raynaud's disease without gangrene: The patient seems to have very mild symptoms overall.  I advised him to avoid cold weather and use gloves as needed.  If symptoms worsen in the future, treatment with a calcium channel blocker can be considered.  However, he reports minimal limitations at the present time.    Disposition:   FU with me as needed.   Signed,  Lorine Bears, MD  09/09/2018 11:22 AM    [facility]

## 2018-09-09 NOTE — Patient Instructions (Signed)
Medication Instructions:  The current medical regimen is effective;  continue present plan and medications.  If you need a refill on your cardiac medications before your next appointment, please call your pharmacy.    Follow-Up: At [facility], you and your health needs are our priority.  As part of our continuing mission to provide you with exceptional heart care, we have created designated Provider Care Teams.  These Care Teams include your primary Cardiologist (physician) and Advanced Practice Providers (APPs -  Physician Assistants and Nurse Practitioners) who all work together to provide you with the care you need, when you need it. You will need a follow up appointment as needed.   You may see Dr.Arida or one of the following Advanced Practice Providers on your designated Care Team:   Corine Shelter, PA-C Judy Pimple, [place] . Marjie Skiff, PA-C

## 2018-09-12 ENCOUNTER — Other Ambulatory Visit: Payer: Self-pay

## 2018-09-12 ENCOUNTER — Encounter (HOSPITAL_COMMUNITY): Payer: Self-pay

## 2018-09-12 ENCOUNTER — Emergency Department (HOSPITAL_COMMUNITY)
Admission: EM | Admit: 2018-09-12 | Discharge: 2018-09-12 | Disposition: A | Discharge status: Home / Self Care | Payer: Medicaid Other | Attending: Emergency Medicine | Admitting: Emergency Medicine

## 2018-09-12 DIAGNOSIS — R42 Dizziness and giddiness: Secondary | ICD-10-CM | POA: Diagnosis not present

## 2018-09-12 DIAGNOSIS — Z5321 Procedure and treatment not carried out due to patient leaving prior to being seen by health care provider: Secondary | ICD-10-CM | POA: Diagnosis not present

## 2018-09-12 DIAGNOSIS — R5383 Other fatigue: Secondary | ICD-10-CM | POA: Insufficient documentation

## 2018-09-12 DIAGNOSIS — R0602 Shortness of breath: Secondary | ICD-10-CM | POA: Diagnosis not present

## 2018-09-12 NOTE — ED Triage Notes (Signed)
Patient c/o SOB, fatigue, headache, and dizziness that started today.

## 2018-11-11 ENCOUNTER — Telehealth: Payer: Self-pay | Admitting: *Deleted

## 2018-11-11 NOTE — Telephone Encounter (Signed)
Pt called states may need another appt he had been sent to another office for decrease circulation in his toes, and he has not been performing cardio exercises.

## 2018-11-11 NOTE — Telephone Encounter (Signed)
I reviewed pt's clinicals including 09/09/2018 appt with Dr. Kirke Corin, which stated if pt was to continue have symptoms they may begin calcium channel blocker. I informed pt of the Assessment and Plan from Dr. Kirke Corin and he states they were kinda of cryptic in their explanation and referred back to Dr. Ardelle Anton. I offered appt to pt and he wanted to get Dr. Gabriel Rung opinion and he may call Dr. Jari Sportsman office .

## 2018-11-11 NOTE — Telephone Encounter (Signed)
Pt called and states he didn't know whether he should go to DR. Arida, but he thinks they referred him back to Dr. Ardelle Anton. I asked pt to explain the symptoms he was currently having and he stated he had moved which had really messed up his back and he was having purple toes occasionally but no pain or deformity, but was having some numbness and tingling. I told pt that may be related to the circulation or his back and Dr. Ardelle Anton could evaluate and if needed could refer to proper specialty or back to Dr. Kirke Corin.

## 2018-11-13 NOTE — Telephone Encounter (Signed)
I would like to see him in the office to evaluate. Thanks.

## 2018-11-20 ENCOUNTER — Ambulatory Visit: Payer: Medicaid Other | Admitting: Podiatry

## 2018-11-21 ENCOUNTER — Telehealth: Payer: Self-pay

## 2018-11-21 NOTE — Telephone Encounter (Signed)
Virtual Visit Pre-Appointment Phone Call  "(Name), I am calling you today to discuss your upcoming appointment. We are currently trying to limit exposure to the virus that causes COVID-19 by seeing patients at home rather than in the office."  1. "What is the BEST phone number to call the day of the visit?" - include this in appointment notes  2. "Do you have or have access to (through a family member/friend) a smartphone with video capability that we can use for your visit?" a. If yes - list this number in appt notes as "cell" (if different from BEST phone #) and list the appointment type as a VIDEO visit in appointment notes b. If no - list the appointment type as a PHONE visit in appointment notes  3. Confirm consent - "In the setting of the current Covid19 crisis, you are scheduled for a (phone or video) visit with your provider on (date) at (time).  Just as we do with many in-office visits, in order for you to participate in this visit, we must obtain consent.  If you'd like, I can send this to your mychart (if signed up) or email for you to review.  Otherwise, I can obtain your verbal consent now.  All virtual visits are billed to your insurance company just like a normal visit would be.  By agreeing to a virtual visit, we'd like you to understand that the technology does not allow for your provider to perform an examination, and thus may limit your provider's ability to fully assess your condition. If your provider identifies any concerns that need to be evaluated in person, we will make arrangements to do so.  Finally, though the technology is pretty good, we cannot assure that it will always work on either your or our end, and in the setting of a video visit, we may have to convert it to a phone-only visit.  In either situation, we cannot ensure that we have a secure connection.  Are you willing to proceed?" STAFF: Did the patient verbally acknowledge consent to telehealth visit? Document  YES/NO here: YES  4.   5. Advise patient to be prepared - "Two hours prior to your appointment, go ahead and check your blood pressure, pulse, oxygen saturation, and your weight (if you have the equipment to check those) and write them all down. When your visit starts, your provider will ask you for this information. If you have an Apple Watch or Kardia device, please plan to have heart rate information ready on the day of your appointment. Please have a pen and paper handy nearby the day of the visit as well."  6. Give patient instructions for MyChart download to smartphone OR Doximity/Doxy.me as below if video visit (depending on what platform provider is using)  7. Inform patient they will receive a phone call 15 minutes prior to their appointment time (may be from unknown caller ID) so they should be prepared to answer    TELEPHONE CALL NOTE  Tyler Trevino has been deemed a candidate for a follow-up tele-health visit to limit community exposure during the Covid-19 pandemic. I spoke with the patient via phone to ensure availability of phone/video source, confirm preferred email & phone number, and discuss instructions and expectations.  I reminded Tyler Trevino to be prepared with any vital sign and/or heart rhythm information that could potentially be obtained via home monitoring, at the time of his visit. I reminded Tyler Trevino to expect a phone  call prior to his visit.  Bishop DublinSharon Elliet Goodnow, [facility] HospitalCMA 11/21/2018 2:36 PM   INSTRUCTIONS FOR DOWNLOADING THE MYCHART APP TO SMARTPHONE  - The patient must first make sure to have activated MyChart and know their login information - If Apple, go to [employer] Store and type in MyChart in the search bar and download the app. If Android, ask patient to go to [employer] Play Store and type in [place] in the search bar and download the app. The app is free but as with any other app downloads, their phone may require them to verify saved payment information  or Apple/Android password.  - The patient will need to then log into the app with their MyChart username and password, and select [place] [facility] as their healthcare provider to link the account. When it is time for your visit, go to the MyChart app, find appointments, and click Begin Video Visit. Be sure to Select Allow for your device to access the Microphone and Camera for your visit. You will then be connected, and your provider will be with you shortly.  **If they have any issues connecting, or need assistance please contact MyChart service desk [phone])**  **If using a computer, in order to ensure the best quality for their visit they will need to use either of the following Internet Browsers: [employer] Edge, or Google Chrome**  IF USING DOXIMITY or DOXY.ME - The patient will receive a link just prior to their visit by text.     FULL LENGTH CONSENT FOR TELE-HEALTH VISIT   I hereby voluntarily request, consent and authorize [facility] and its employed or contracted physicians, physician assistants, nurse practitioners or other licensed health care professionals (the Practitioner), to provide me with telemedicine health care services (the "Services") as deemed necessary by the treating Practitioner. I acknowledge and consent to receive the Services by the Practitioner via telemedicine. I understand that the telemedicine visit will involve communicating with the Practitioner through live audiovisual communication technology and the disclosure of certain medical information by electronic transmission. I acknowledge that I have been given the opportunity to request an in-person assessment or other available alternative prior to the telemedicine visit and am voluntarily participating in the telemedicine visit.  I understand that I have the right to withhold or withdraw my consent to the use of telemedicine in the course of my care at any time, without affecting my right to future  care or treatment, and that the Practitioner or I may terminate the telemedicine visit at any time. I understand that I have the right to inspect all information obtained and/or recorded in the course of the telemedicine visit and may receive copies of available information for a reasonable fee.  I understand that some of the potential risks of receiving the Services via telemedicine include:  Marland Kitchen. Delay or interruption in medical evaluation due to technological equipment failure or disruption; . Information transmitted may not be sufficient (e.g. poor resolution of images) to allow for appropriate medical decision making by the Practitioner; and/or  . In rare instances, security protocols could fail, causing a breach of personal health information.  Furthermore, I acknowledge that it is my responsibility to provide information about my medical history, conditions and care that is complete and accurate to the best of my ability. I acknowledge that Practitioner's advice, recommendations, and/or decision may be based on factors not within their control, such as incomplete or inaccurate data provided by me or distortions of diagnostic images or specimens that may result from  electronic transmissions. I understand that the practice of medicine is not an exact science and that Practitioner makes no warranties or guarantees regarding treatment outcomes. I acknowledge that I will receive a copy of this consent concurrently upon execution via email to the email address I last provided but may also request a printed copy by calling the office of [place].    I understand that my insurance will be billed for this visit.   I have read or had this consent read to me. . I understand the contents of this consent, which adequately explains the benefits and risks of the Services being provided via telemedicine.  . I have been provided ample opportunity to ask questions regarding this consent and the Services and have  had my questions answered to my satisfaction. . I give my informed consent for the services to be provided through the use of telemedicine in my medical care  By participating in this telemedicine visit I agree to the above.

## 2018-11-25 ENCOUNTER — Telehealth (INDEPENDENT_AMBULATORY_CARE_PROVIDER_SITE_OTHER): Payer: Medicaid Other | Admitting: Cardiovascular Disease

## 2018-11-25 ENCOUNTER — Encounter: Payer: Self-pay | Admitting: Cardiovascular Disease

## 2018-11-25 VITALS — Ht 72.0 in | Wt 160.0 lb

## 2018-11-25 DIAGNOSIS — I7300 Raynaud's syndrome without gangrene: Secondary | ICD-10-CM

## 2018-11-25 NOTE — Progress Notes (Signed)
Thank you :)

## 2018-11-25 NOTE — Patient Instructions (Signed)
Medication Instructions:  No new medications If you need a refill on your cardiac medications before your next appointment, please call your pharmacy.   Lab work: None If you have labs (blood work) drawn today and your tests are completely normal, you will receive your results only by: Marland Kitchen MyChart Message (if you have MyChart) OR . A paper copy in the mail If you have any lab test that is abnormal or we need to change your treatment, we will call you to review the results.  Testing/Procedures: None  Follow-Up: Follow-up with Dr. Kirke Corin as needed if discomfort in toes and fingers get worse in the future.

## 2018-11-25 NOTE — Progress Notes (Signed)
Virtual Visit via Video Note   This visit type was conducted due to national recommendations for restrictions regarding the COVID-19 Pandemic (e.g. social distancing) in an effort to limit this patient's exposure and mitigate transmission in our community.  Due to his co-morbid illnesses, this patient is at least at moderate risk for complications without adequate follow up.  This format is felt to be most appropriate for this patient at this time.  All issues noted in this document were discussed and addressed.  A limited physical exam was performed with this format.  Please refer to the patient's chart for his consent to telehealth for [facility].   Date:  11/25/2018   ID:  Tyler Trevino, DOB 1971/05/31, MRN [id number]  Patient Location: Home Provider Location: Office  PCP:  Norm Salt, PA  Cardiologist:  No primary care provider on file.  Electrophysiologist:  None   Evaluation Performed:  Follow-Up Visit  Chief Complaint: No new complaints  History of Present Illness:    Tyler Trevino is a 48 y.o. male was seen via video visit regarding follow-up of  possible Raynaud's disease. The patient has no prior cardiac history.  He has significant mental health issues and currently on disability.  He has chronic back pain with scoliosis, attention deficit disorder and GERD.  He is not a smoker.  He drinks alcohol occasionally.  Family history is negative for premature coronary artery disease. He was seen recently for cold fingers and toes.  He underwent vascular testing which showed normal ABI bilaterally with triphasic waveforms distally.  However, toe pressures were significantly dampened.    He was suspected of having mild Raynaud's.  His symptoms were mild and did not require treatment.  He was reassured.  However, he became concerned and he wanted to get in touch with [place] again to make sure he is not going to get gangrene.  He reports no new symptoms he just seems to be  more worried about the situation.  He is actually not having any issues especially in the current warm weather.  There is no discoloration.   The patient does not have symptoms concerning for COVID-19 infection (fever, chills, cough, or new shortness of breath).    Past Medical History:  Diagnosis Date   ADHD (attention deficit hyperactivity disorder)    Anxiety    Depression    No past surgical history on file.   Current Meds  Medication Sig   buPROPion (WELLBUTRIN XL) 150 MG 24 hr tablet Take 150 mg by mouth 2 (two) times daily.    gabapentin (NEURONTIN) 300 MG capsule Take 300 mg by mouth 3 (three) times daily.   lisdexamfetamine (VYVANSE) 70 MG capsule Take 70 mg by mouth daily.   Multiple Vitamin (MULTIVITAMIN WITH MINERALS) TABS tablet Take 1 tablet by mouth daily.   pantoprazole (PROTONIX) 40 MG tablet Take 40 mg by mouth daily.   propranolol (INDERAL) 10 MG tablet Take 10 mg by mouth daily as needed (anxiety).     Allergies:   Patient has no known allergies.   Social History   Tobacco Use   Smoking status: Never Smoker   Smokeless tobacco: Never Used  Substance Use Topics   Alcohol use: Yes    Comment: occ   Drug use: No     Family Hx: The patient's family history includes Cancer in his mother; Heart disease in his father.  ROS:   Please see the history of present illness.  All other systems reviewed and are negative.   Prior CV studies:   The following studies were reviewed today:    Labs/Other Tests and Data Reviewed:    EKG:  No ECG reviewed.  Recent Labs: No results found for requested labs within last 8760 hours.   Recent Lipid Panel No results found for: CHOL, TRIG, HDL, CHOLHDL, LDLCALC, LDLDIRECT  Wt Readings from Last 3 Encounters:  11/25/18 160 lb (72.6 kg)  09/12/18 163 lb 2.3 oz (74 kg)  09/09/18 163 lb 3.2 oz (74 kg)     Objective:    Vital Signs:  Ht 6' (1.829 m)    Wt 160 lb (72.6 kg)    BMI 21.70 kg/m     VITAL SIGNS:  reviewed GEN:  no acute distress EYES:  sclerae anicteric, EOMI - Extraocular Movements Intact RESPIRATORY:  normal respiratory effort, symmetric expansion SKIN:  no rash, lesions or ulcers. MUSCULOSKELETAL:  no obvious deformities. NEURO:  alert and oriented x 3, no obvious focal deficit PSYCH:  normal affect  ASSESSMENT & PLAN:    1.  Raynaud's disease without gangrene: The patient seems to have very mild symptoms overall and this have not changed since his most recent evaluation.  I discussed with him the benign nature of what he has.  I asked him to avoid cold weather.  I explained to him that he has no restrictions for exercise or other physical activities.  He currently has minimal symptoms that do not warrant treatment with a calcium channel blocker. I asked him to follow-up with me as needed if his symptoms worsen.   COVID-19 Education: The signs and symptoms of COVID-19 were discussed with the patient and how to seek care for testing (follow up with PCP or arrange E-visit).  The importance of social distancing was discussed today.  Time:   Today, I have spent 12 minutes with the patient with telehealth technology discussing the above problems.     Medication Adjustments/Labs and Tests Ordered: Current medicines are reviewed at length with the patient today.  Concerns regarding medicines are outlined above.   Tests Ordered: No orders of the defined types were placed in this encounter.   Medication Changes: No orders of the defined types were placed in this encounter.   Disposition:  Follow up prn  Signed, Lorine BearsMuhammad Elody Kleinsasser, MD  11/25/2018 11:34 AM    [facility]

## 2018-12-05 ENCOUNTER — Ambulatory Visit: Payer: Medicaid Other | Admitting: Podiatry

## 2019-06-16 ENCOUNTER — Telehealth: Payer: Self-pay | Admitting: *Deleted

## 2019-06-16 NOTE — Telephone Encounter (Signed)
Pt is calling concerning x-rays.

## 2019-06-17 NOTE — Telephone Encounter (Signed)
Called pt back about his x-rays. Pt stated his PCP sent him to [place] and that we referred him to [facility], Dr. Fletcher Anon for some testing. Pt wants to know if he needs to follow up with [place] or his PCP?

## 2019-06-17 NOTE — Telephone Encounter (Signed)
Hey Dr Jacqualyn Posey, can you please advise?Marland Kitchen Thanks Lattie Haw

## 2019-06-18 NOTE — Telephone Encounter (Signed)
If he is having issues I am happy to see him back in the office. I have not seen him in some time.

## 2019-06-19 NOTE — Telephone Encounter (Signed)
Left message for the patient to call me. Lattie Haw

## 2020-07-14 ENCOUNTER — Other Ambulatory Visit: Payer: Self-pay

## 2020-07-14 ENCOUNTER — Encounter (HOSPITAL_COMMUNITY): Payer: Self-pay | Admitting: Emergency Medicine

## 2020-07-14 ENCOUNTER — Ambulatory Visit (HOSPITAL_COMMUNITY)
Admission: EM | Admit: 2020-07-14 | Discharge: 2020-07-14 | Disposition: A | Discharge status: Home / Self Care | Payer: Medicaid Other | Attending: Family Medicine | Admitting: Family Medicine

## 2020-07-14 DIAGNOSIS — U071 COVID-19: Secondary | ICD-10-CM | POA: Diagnosis not present

## 2020-07-14 DIAGNOSIS — B349 Viral infection, unspecified: Secondary | ICD-10-CM | POA: Diagnosis not present

## 2020-07-14 DIAGNOSIS — Z20822 Contact with and (suspected) exposure to covid-19: Secondary | ICD-10-CM | POA: Diagnosis present

## 2020-07-14 NOTE — ED Triage Notes (Signed)
Pt c/o cold sx onset 3 days associated w/chills, fatigue, headache, body aches, dizziness  Denies f/v/n/d  Taking OTC acetaminophen w/temp relief  A&O x4... NAD.Marland Kitchen. ambulatory

## 2020-07-14 NOTE — Discharge Instructions (Addendum)
Go home to rest Drink plenty of fluids Take Tylenol for pain or fever You may take over-the-counter cough and cold medicines as needed You must quarantine at home until your test result is available You can check for your test result in MyChart CALL FOR PROBLEMS

## 2020-07-14 NOTE — ED Provider Notes (Signed)
[facility]    CSN: [id number] Arrival date & time: 07/14/20  1556      History   Chief Complaint Chief Complaint  Patient presents with  . URI    HPI Tyler Trevino is a 50 y.o. male.   HPI  Patient states he has been COVID vaccinated.  He has been sick for 3 days.  He states he has "sinus" symptoms including nasal congestion and face pressure, postnasal drip and mild scratchy throat.  In addition he has had fever and chills, fatigue headaches body aches and dizziness.  He has had not had any known exposure to COVID.  He g goes to the [facility] to exercise.  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  He states these are the only places he is exposed to people and he does try to wear a mask and social distance.  Past Medical History:  Diagnosis Date  . ADHD (attention deficit hyperactivity disorder)   . Anxiety   . Depression     There are no problems to display for this patient.   History reviewed. No pertinent surgical history.     Home Medications    Prior to Admission medications   Medication Sig Start Date End Date Taking? Authorizing Provider  buPROPion (WELLBUTRIN XL) 150 MG 24 hr tablet Take 150 mg by mouth 2 (two) times daily.     [provider]  gabapentin (NEURONTIN) 300 MG capsule Take 300 mg by mouth 3 (three) times daily.    [provider]  lisdexamfetamine (VYVANSE) 70 MG capsule Take 70 mg by mouth daily.    [provider]  Multiple Vitamin (MULTIVITAMIN WITH MINERALS) TABS tablet Take 1 tablet by mouth daily.    [provider]  pantoprazole (PROTONIX) 40 MG tablet Take 40 mg by mouth daily.    [provider]  propranolol (INDERAL) 10 MG tablet Take 10 mg by mouth daily as needed (anxiety).    [provider]    Family History Family History  Problem Relation Age of Onset  . Cancer Mother   . Heart disease Father     Social History Social History   Tobacco Use  . Smoking  status: Never Smoker  . Smokeless tobacco: Never Used  Vaping Use  . Vaping Use: Never used  Substance Use Topics  . Alcohol use: Yes    Comment: occ  . Drug use: No     Allergies   Patient has no known allergies.   Review of Systems Review of Systems See HPI  Physical Exam Triage Vital Signs ED Triage Vitals [07/14/20 1642]  Enc Vitals Group     BP 124/88     Pulse Rate 82     Resp 20     Temp 98.6 F (37 C)     Temp Source Oral     SpO2 100 %     Weight      Height      Head Circumference      Peak Flow      Pain Score 2     Pain Loc      Pain Edu?      Excl. in GC?    No data found.  Updated Vital Signs BP 124/88 (BP Location: Right Arm)   Pulse 82   Temp 98.6 F (37 C) (Oral)   Resp 20   SpO2 100%   Physical Exam Constitutional:      General: He is not  in acute distress.    Appearance: He is well-developed, normal weight and well-nourished.  HENT:     Head: Normocephalic and atraumatic.     Nose: Congestion present.     Mouth/Throat:     Pharynx: Posterior oropharyngeal erythema present.  Eyes:     Conjunctiva/sclera: Conjunctivae normal.     Pupils: Pupils are equal, round, and reactive to light.  Cardiovascular:     Rate and Rhythm: Normal rate and regular rhythm.     Heart sounds: Normal heart sounds.  Pulmonary:     Effort: Pulmonary effort is normal. No respiratory distress.     Breath sounds: Normal breath sounds. No wheezing or rales.  Abdominal:     General: There is no distension.     Palpations: Abdomen is soft.  Musculoskeletal:        General: No edema. Normal range of motion.     Cervical back: Normal range of motion.  Skin:    General: Skin is warm and dry.  Neurological:     Mental Status: He is alert.  Psychiatric:        Behavior: Behavior normal.      UC Treatments / Results  Labs (all labs ordered are listed, but only abnormal results are displayed) Labs Reviewed - No data to display  EKG   Radiology No  results found.  Procedures Procedures (including critical care time)  Medications Ordered in UC Medications - No data to display  Initial Impression / Assessment and Plan / UC Course  I have reviewed the triage vital signs and the nursing notes.  Pertinent labs & imaging results that were available during my care of the patient were reviewed by me and considered in my medical decision making (see chart for details).     Patient is here for COVID testing.  He understands this is a virus and antibiotics will not help.  Importance of quarantine is discussed.  Current CDC guidelines of 5 days of isolation if COVID test is positive we will reviewed Final Clinical Impressions(s) / UC Diagnoses   Final diagnoses:  Viral syndrome  Encounter for laboratory testing for COVID-19 virus     Discharge Instructions     Go home to rest Drink plenty of fluids Take Tylenol for pain or fever You may take over-the-counter cough and cold medicines as needed You must quarantine at home until your test result is available You can check for your test result in MyChart CALL FOR PROBLEMS   ED Prescriptions    None     PDMP not reviewed this encounter.   Eustace Moore, MD 07/14/20 1750

## 2020-07-15 LAB — SARS CORONAVIRUS 2 (TAT 6-24 HRS): SARS Coronavirus 2: POSITIVE — AB

## 2021-07-18 ENCOUNTER — Telehealth: Payer: Self-pay | Admitting: Hematology and Oncology

## 2021-07-18 NOTE — Telephone Encounter (Signed)
Scheduled appt per 1/17 referral. Pt is aware of appt date and time. Pt is aware to arrive 15 mins prior to appt time.  °

## 2021-08-03 ENCOUNTER — Other Ambulatory Visit: Payer: Self-pay

## 2021-08-03 ENCOUNTER — Inpatient Hospital Stay: Payer: Medicaid Other

## 2021-08-03 ENCOUNTER — Inpatient Hospital Stay: Payer: Medicaid Other | Attending: Hematology and Oncology | Admitting: Hematology and Oncology

## 2021-08-03 VITALS — BP 130/101 | HR 70 | Temp 97.2°F | Resp 18 | Ht 72.0 in | Wt 180.2 lb

## 2021-08-03 DIAGNOSIS — Z803 Family history of malignant neoplasm of breast: Secondary | ICD-10-CM | POA: Insufficient documentation

## 2021-08-03 DIAGNOSIS — F419 Anxiety disorder, unspecified: Secondary | ICD-10-CM | POA: Diagnosis not present

## 2021-08-03 DIAGNOSIS — D709 Neutropenia, unspecified: Secondary | ICD-10-CM | POA: Insufficient documentation

## 2021-08-03 DIAGNOSIS — F32A Depression, unspecified: Secondary | ICD-10-CM | POA: Diagnosis not present

## 2021-08-03 DIAGNOSIS — D708 Other neutropenia: Secondary | ICD-10-CM

## 2021-08-03 DIAGNOSIS — Z79899 Other long term (current) drug therapy: Secondary | ICD-10-CM | POA: Insufficient documentation

## 2021-08-03 LAB — CBC WITH DIFFERENTIAL (CANCER CENTER ONLY)
Abs Immature Granulocytes: 0 10*3/uL (ref 0.00–0.07)
Basophils Absolute: 0 10*3/uL (ref 0.0–0.1)
Basophils Relative: 1 %
Eosinophils Absolute: 0.3 10*3/uL (ref 0.0–0.5)
Eosinophils Relative: 8 %
HCT: 45.2 % (ref 39.0–52.0)
Hemoglobin: 15.4 g/dL (ref 13.0–17.0)
Immature Granulocytes: 0 %
Lymphocytes Relative: 26 %
Lymphs Abs: 0.9 10*3/uL (ref 0.7–4.0)
MCH: 31.8 pg (ref 26.0–34.0)
MCHC: 34.1 g/dL (ref 30.0–36.0)
MCV: 93.4 fL (ref 80.0–100.0)
Monocytes Absolute: 0.5 10*3/uL (ref 0.1–1.0)
Monocytes Relative: 14 %
Neutro Abs: 1.7 10*3/uL (ref 1.7–7.7)
Neutrophils Relative %: 51 %
Platelet Count: 275 10*3/uL (ref 150–400)
RBC: 4.84 MIL/uL (ref 4.22–5.81)
RDW: 13.3 % (ref 11.5–15.5)
WBC Count: 3.3 10*3/uL — ABNORMAL LOW (ref 4.0–10.5)
nRBC: 0 % (ref 0.0–0.2)

## 2021-08-03 LAB — CMP (CANCER CENTER ONLY)
ALT: 16 U/L (ref 0–44)
AST: 27 U/L (ref 15–41)
Albumin: 4.5 g/dL (ref 3.5–5.0)
Alkaline Phosphatase: 56 U/L (ref 38–126)
Anion gap: 5 (ref 5–15)
BUN: 11 mg/dL (ref 6–20)
CO2: 28 mmol/L (ref 22–32)
Calcium: 9.4 mg/dL (ref 8.9–10.3)
Chloride: 100 mmol/L (ref 98–111)
Creatinine: 0.7 mg/dL (ref 0.61–1.24)
GFR, Estimated: >60 mL/min (ref >60)
Glucose, Bld: 99 mg/dL (ref 70–99)
Potassium: 4.3 mmol/L (ref 3.5–5.1)
Sodium: 133 mmol/L — ABNORMAL LOW (ref 135–145)
Total Bilirubin: 0.8 mg/dL (ref 0.3–1.2)
Total Protein: 7.6 g/dL (ref 6.5–8.1)

## 2021-08-03 LAB — VITAMIN B12: Vitamin B-12: 487 pg/mL (ref 180–914)

## 2021-08-03 LAB — HEPATITIS C ANTIBODY: HCV Ab: NONREACTIVE

## 2021-08-03 LAB — HEPATITIS B SURFACE ANTIBODY,QUALITATIVE: Hep B S Ab: NONREACTIVE

## 2021-08-03 LAB — FOLATE: Folate: 26.7 ng/mL (ref >5.9)

## 2021-08-03 LAB — HEPATITIS B CORE ANTIBODY, TOTAL: Hep B Core Total Ab: NONREACTIVE

## 2021-08-03 LAB — HEPATITIS B SURFACE ANTIGEN: Hepatitis B Surface Ag: NONREACTIVE

## 2021-08-03 NOTE — Progress Notes (Signed)
[facility] Telephone:([phone]   [phone]  INITIAL CONSULT NOTE  Patient Care Team: Norm SaltVanstory, Ashley N, PA as PCP - General (Physician Assistant)  Hematological/Oncological History # Leukopenia 07/12/2021: WBC 2.2, Hgb 14.6, MCV 93.2, Plt 275, ANC 1016 08/03/2021: establish care with Dr. Leonides Schanzorsey   CHIEF COMPLAINTS/PURPOSE OF CONSULTATION:  "Leukopenia "  HISTORY OF PRESENTING ILLNESS:  Tyler Trevino 51 y.o. male with medical history significant for depression/anxiety and chronic back pain who presents for evaluation of leukopenia.   On review of the previous records Tyler Trevino last had labs collected on 07/12/2021 at which time he was found to have white blood cell count 2.2, hemoglobin 14.6, MCV of 93.2, and platelets of 275.  His ANC was noted to be 1016.  Due to concern for these findings patient was referred to hematology for further evaluation and management.  On exam today Tyler Trevino reports that he has "physically been fine" in the interim since his last visit with his primary care provider.  He notes he is not been having any issues with infections and he notes that in the past 10 to 15 years he has been sick maybe 5 times.  He did have COVID as well as the flu but no residual symptoms from those and his last infection of COVID was in March 2022.  He notes that he is not currently on any special diets and has no restrictions in the food that he eats.  He notes that he does occasionally have episodes of diarrhea and that his most recent one was likely due to "herbs and tinctures" that his roommate provided him in order to help bolster his white blood cell count.  The patient notes that he is currently on food stamps as does not necessarily eat particularly well.  On further discussion he notes that he has previously been checked for HIV but is unsure if he has been checked for hepatitis.  He notes he does not have any unprotected sex and in 2015 he  had a "barrage of STD test.  With his current partner he notes she has also recently tested as negative.  He notes that he has not done any smoking since 1996.  He notes that on the weekend he does occasionally drink some beer.  He is currently on disability.  On further review he notes that his father has a history of low white blood cell counts.  He believes that his father side is of [place] ancestry and he is unsure about his mother side.  He reports his mother is healthy but his maternal aunt has had breast cancer.  He does not have any children.  He currently denies any fevers, chills, sweats, nausea, vomiting or diarrhea.  A full 10 point ROS is listed below.  MEDICAL HISTORY:  Past Medical History:  Diagnosis Date   ADHD (attention deficit hyperactivity disorder)    Anxiety    Depression     SURGICAL HISTORY: No past surgical history on file.  SOCIAL HISTORY: Social History   Socioeconomic History   Marital status: Single    Spouse name: Not on file   Number of children: Not on file   Years of education: Not on file   Highest education level: Not on file  Occupational History   Not on file  Tobacco Use   Smoking status: Never   Smokeless tobacco: Never  Vaping Use   Vaping Use: Never used  Substance and Sexual Activity  Alcohol use: Yes    Comment: occ   Drug use: No   Sexual activity: Not on file  Other Topics Concern   Not on file  Social History Narrative   Not on file   Social Determinants of Health   Financial Resource Strain: Not on file  Food Insecurity: Not on file  Transportation Needs: Not on file  Physical Activity: Not on file  Stress: Not on file  Social Connections: Not on file  Intimate Partner Violence: Not on file    FAMILY HISTORY: Family History  Problem Relation Age of Onset   Cancer Mother    Heart disease Father     ALLERGIES:  has No Known Allergies.  MEDICATIONS:  Current Outpatient Medications  Medication Sig Dispense  Refill   buPROPion (WELLBUTRIN XL) 150 MG 24 hr tablet Take 150 mg by mouth 2 (two) times daily.      gabapentin (NEURONTIN) 300 MG capsule Take 300 mg by mouth 3 (three) times daily.     lisdexamfetamine (VYVANSE) 70 MG capsule Take 70 mg by mouth daily.     Multiple Vitamin (MULTIVITAMIN WITH MINERALS) TABS tablet Take 1 tablet by mouth daily.     pantoprazole (PROTONIX) 40 MG tablet Take 40 mg by mouth daily.     propranolol (INDERAL) 10 MG tablet Take 10 mg by mouth daily as needed (anxiety).     No current facility-administered medications for this visit.    REVIEW OF SYSTEMS:   Constitutional: ( - ) fevers, ( - )  chills , ( - ) night sweats Eyes: ( - ) blurriness of vision, ( - ) double vision, ( - ) watery eyes Ears, nose, mouth, throat, and face: ( - ) mucositis, ( - ) sore throat Respiratory: ( - ) cough, ( - ) dyspnea, ( - ) wheezes Cardiovascular: ( - ) palpitation, ( - ) chest discomfort, ( - ) lower extremity swelling Gastrointestinal:  ( - ) nausea, ( - ) heartburn, ( - ) change in bowel habits Skin: ( - ) abnormal skin rashes Lymphatics: ( - ) new lymphadenopathy, ( - ) easy bruising Neurological: ( - ) numbness, ( - ) tingling, ( - ) new weaknesses Behavioral/Psych: ( - ) mood change, ( - ) new changes  All other systems were reviewed with the patient and are negative.  PHYSICAL EXAMINATION:  Vitals:   08/03/21 1307  BP: (!) 130/101  Pulse: 70  Resp: 18  Temp: (!) 97.2 F (36.2 C)  SpO2: 100%   Filed Weights   08/03/21 1307  Weight: 180 lb 3.2 oz (81.7 kg)    GENERAL: well appearing middle-aged Caucasian male in NAD  SKIN: skin color, texture, turgor are normal, no rashes or significant lesions EYES: conjunctiva are pink and non-injected, sclera clear LUNGS: clear to auscultation and percussion with normal breathing effort HEART: regular rate & rhythm and no murmurs and no lower extremity edema Musculoskeletal: no cyanosis of digits and no clubbing   PSYCH: alert & oriented x 3, fluent speech NEURO: no focal motor/sensory deficits  LABORATORY DATA:  I have reviewed the data as listed CBC Latest Ref Rng & Units 08/03/2021 01/10/2017  WBC 4.0 - 10.5 K/uL 3.3(L) 4.4  Hemoglobin 13.0 - 17.0 g/dL 15.4 14.4  Hematocrit 39.0 - 52.0 % 45.2 40.7  Platelets 150 - 400 K/uL 275 321    CMP Latest Ref Rng & Units 08/03/2021 01/10/2017  Glucose 70 - 99 mg/dL 99 98  BUN 6 -  20 mg/dL 11 10  Creatinine 0.61 - 1.24 mg/dL 0.70 0.72  Sodium 135 - 145 mmol/L 133(L) 137  Potassium 3.5 - 5.1 mmol/L 4.3 3.8  Chloride 98 - 111 mmol/L 100 99(L)  CO2 22 - 32 mmol/L 28 30  Calcium 8.9 - 10.3 mg/dL 9.4 9.5  Total Protein 6.5 - 8.1 g/dL 7.6 7.4  Total Bilirubin 0.3 - 1.2 mg/dL 0.8 0.8  Alkaline Phos 38 - 126 U/L 56 52  AST 15 - 41 U/L 27 21  ALT 0 - 44 U/L 16 8(L)     ASSESSMENT & PLAN Tyler Trevino 51 y.o. male with medical history significant for depression/anxiety and chronic back pain who presents for evaluation of leukopenia.   After review of the labs, review of the records, and discussion with the patient the patients findings are most consistent with mild leukopenia of unclear etiology.  The bulk of our work-up will focus on assuring that he does not have any other common causes of leukopenia.  We will evaluate him for nutritional deficiency, viral etiology, and possible inflammatory conditions.  This is low white blood cell count is of a single timestamp and it is entirely possible that his most recent CBC was anomalous.  We will confirm that with her blood work today.  # Neutropenia, unclear etiology   --repeat CBC and CMP  --infectious serology testing with Hep B, Hep C, and HIV  --nutritional evaluation with Vitamin b12, folate  --RTC in 6 months time or sooner if there is an issue with the above labs.   Orders Placed This Encounter  Procedures   CBC with Differential ([place] Only)    Standing Status:   Future    Number of  Occurrences:   1    Standing Expiration Date:   08/03/2022   CMP ([place] only)    Standing Status:   Future    Number of Occurrences:   1    Standing Expiration Date:   08/03/2022   Hepatitis B core antibody, total    Standing Status:   Future    Number of Occurrences:   1    Standing Expiration Date:   08/03/2022   Hepatitis B surface antigen    Standing Status:   Future    Number of Occurrences:   1    Standing Expiration Date:   08/03/2022   Hepatitis B surface antibody    Standing Status:   Future    Number of Occurrences:   1    Standing Expiration Date:   08/03/2022   Hepatitis C antibody    Standing Status:   Future    Number of Occurrences:   1    Standing Expiration Date:   08/03/2022   HIV antibody (with reflex)    Standing Status:   Future    Number of Occurrences:   1    Standing Expiration Date:   08/03/2022   Vitamin B12    Standing Status:   Future    Number of Occurrences:   1    Standing Expiration Date:   08/03/2022   Folate, Serum    Standing Status:   Future    Number of Occurrences:   1    Standing Expiration Date:   08/03/2022    All questions were answered. The patient knows to call the clinic with any problems, questions or concerns.  A total of more than 60 minutes were spent on this encounter with face-to-face time and  non-face-to-face time, including preparing to see the patient, ordering tests and/or medications, counseling the patient and coordination of care as outlined above.   Ledell Peoples, MD Department of Hematology/Oncology [place] [facility] Phone: [phone] Pager: [phone] Email: [email or web address]  08/05/2021 12:32 PM

## 2021-08-04 LAB — HIV ANTIBODY (ROUTINE TESTING W REFLEX): HIV Screen 4th Generation wRfx: NONREACTIVE

## 2021-08-07 ENCOUNTER — Telehealth: Payer: Self-pay | Admitting: Hematology and Oncology

## 2021-08-07 NOTE — Telephone Encounter (Signed)
Scheduled per 2/2 los, attempted to call pt but voicemail was full, will mail calender

## 2021-08-10 ENCOUNTER — Telehealth: Payer: Self-pay | Admitting: *Deleted

## 2021-08-10 NOTE — Telephone Encounter (Signed)
Received call from pt regarding lab results.  Please review and advise.  Thank you

## 2021-08-11 ENCOUNTER — Telehealth: Payer: Self-pay

## 2021-08-11 NOTE — Telephone Encounter (Signed)
Attempted to call pt to relay the following results per Dr. Leonides Schanz: Unable to leave message VM full.  Please let Tyler Trevino know that his WBC is improving (increased to 3.3) but we did not find a clear cause in his workup. There was no evidence of nutritional deficiency, hepatitis, or other cause. We will plan to see him back in 6 months time to see if his counts have normalized.   My chart message sent with this information.

## 2021-10-25 ENCOUNTER — Emergency Department (HOSPITAL_COMMUNITY)
Admission: EM | Admit: 2021-10-25 | Discharge: 2021-10-25 | Disposition: A | Discharge status: Home / Self Care | Payer: Medicaid Other | Attending: Emergency Medicine | Admitting: Emergency Medicine

## 2021-10-25 ENCOUNTER — Other Ambulatory Visit: Payer: Self-pay

## 2021-10-25 ENCOUNTER — Encounter (HOSPITAL_COMMUNITY): Payer: Self-pay | Admitting: Oncology

## 2021-10-25 DIAGNOSIS — S90422A Blister (nonthermal), left great toe, initial encounter: Secondary | ICD-10-CM | POA: Insufficient documentation

## 2021-10-25 DIAGNOSIS — R238 Other skin changes: Secondary | ICD-10-CM | POA: Diagnosis not present

## 2021-10-25 DIAGNOSIS — W57XXXA Bitten or stung by nonvenomous insect and other nonvenomous arthropods, initial encounter: Secondary | ICD-10-CM | POA: Diagnosis not present

## 2021-10-25 MED ORDER — BACITRACIN ZINC 500 UNIT/GM EX OINT
TOPICAL_OINTMENT | Freq: Two times a day (BID) | CUTANEOUS | Status: DC
  Administered 2021-10-25: 2 via TOPICAL
  Filled 2021-10-25: qty 1.8

## 2021-10-25 MED ORDER — LIDOCAINE HCL (PF) 1 % IJ SOLN
10.0000 mL | Freq: Once | INTRAMUSCULAR | Status: DC
  Filled 2021-10-25: qty 30

## 2021-10-25 NOTE — ED Notes (Signed)
Pt ambulatory without assistance.  

## 2021-10-25 NOTE — Discharge Instructions (Signed)
Contact a health care provider if: Your cyst or abscess returns. You have more redness, swelling, or pain around your incision. You have more fluid or blood coming from your incision. Your incision feels warm to the touch. You have pus or a bad smell coming from your incision. You have red streaks above or below the incision site. Get help right away if: You have severe pain or bleeding. You cannot eat or drink without vomiting. You have a fever or chills. You have redness that spreads quickly. You have decreased urine output. You become short of breath. You have chest pain. You cough up blood. The affected area becomes numb or starts to tingle. 

## 2021-10-25 NOTE — ED Triage Notes (Signed)
Pt reports he thinks he was bitten by a fire ant or spider on his left great toe.  Fluid filled pocket noted to left great toe.  ?

## 2021-10-25 NOTE — ED Notes (Signed)
I provided reinforced discharge education based off of discharge instructions. Pt acknowledged and understood my education. Pt had no further questions/concerns for provider/myself.  °

## 2021-10-25 NOTE — ED Provider Notes (Signed)
?[facility]-EMERGENCY DEPT ?Provider Note ? ? ?CSN: [id number] ?Arrival date & time: 10/25/21  1856 ? ?  ? ?History ? ?Chief Complaint  ?Patient presents with  ? Insect Bite  ? ? ?Tyler Trevino is a 51 y.o. male who presents with a blister to his L great toe. Onset 3 days ago. He thinks it may have beena bite from a fire ant because in the past he had fire and bites that became bullous. He denies fever, chills, pain or hx of trauma, excessive walking, poorly fitting shoes, or DM.  He denies pain, numbness or redness.  Patient states that it only hurts when he walks and applies pressure to the toe. ? ? ?HPI ? ?  ? ?Home Medications ?Prior to Admission medications   ?Medication Sig Start Date End Date Taking? Authorizing Provider  ?buPROPion (WELLBUTRIN XL) 150 MG 24 hr tablet Take 150 mg by mouth 2 (two) times daily.     [provider]  ?gabapentin (NEURONTIN) 300 MG capsule Take 300 mg by mouth 3 (three) times daily.    [provider]  ?lisdexamfetamine (VYVANSE) 70 MG capsule Take 70 mg by mouth daily.    [provider]  ?Multiple Vitamin (MULTIVITAMIN WITH MINERALS) TABS tablet Take 1 tablet by mouth daily.    [provider]  ?pantoprazole (PROTONIX) 40 MG tablet Take 40 mg by mouth daily.    [provider]  ?propranolol (INDERAL) 10 MG tablet Take 10 mg by mouth daily as needed (anxiety).    [provider]  ?   ? ?Allergies    ?Patient has no known allergies.   ? ?Review of Systems   ?Review of Systems ? ?Physical Exam ?Updated Vital Signs ?BP (!) 129/99 (BP Location: Right Arm)   Pulse 94   Temp 98.4 ?F (36.9 ?C) (Oral)   Resp 18   Ht 6' (1.829 m)   Wt 77.1 kg   SpO2 100%   BMI 23.06 kg/m?  ?Physical Exam ?Vitals and nursing note reviewed.  ?Constitutional:   ?   General: He is not in acute distress. ?   Appearance: He is well-developed. He is not diaphoretic.  ?HENT:  ?   Head: Normocephalic and atraumatic.  ?Eyes:  ?    General: No scleral icterus. ?   Conjunctiva/sclera: Conjunctivae normal.  ?Cardiovascular:  ?   Rate and Rhythm: Normal rate and regular rhythm.  ?   Heart sounds: Normal heart sounds.  ?Pulmonary:  ?   Effort: Pulmonary effort is normal. No respiratory distress.  ?   Breath sounds: Normal breath sounds.  ?Abdominal:  ?   Palpations: Abdomen is soft.  ?   Tenderness: There is no abdominal tenderness.  ?Musculoskeletal:  ?   Cervical back: Normal range of motion and neck supple.  ?Skin: ?   General: Skin is warm and dry.  ?   Comments: Blister to the left great toe no redness or induration  ?Neurological:  ?   Mental Status: He is alert.  ?Psychiatric:     ?   Behavior: Behavior normal.  ? ? ?ED Results / Procedures / Treatments   ?Labs ?(all labs ordered are listed, but only abnormal results are displayed) ?Labs Reviewed - No data to display ? ?EKG ?None ? ?Radiology ?No results found. ? ?Procedures ?Marland Kitchen.Incision and Drainage ? ?Date/Time: 10/25/2021 10:33 AM ?Performed by: Arthor Captain, PA-C ?Authorized by: Arthor Captain, PA-C  ? ?Consent:  ?  Consent obtained:  Verbal ?  Risks discussed:  Bleeding, infection and pain ?  Alternatives discussed:  No treatment ?Universal protocol:  ?  Patient identity confirmed:  Verbally with patient ?Location:  ?  Type:  Bulla ?  Size:  2 cm ?  Location:  Lower extremity ?  Lower extremity location:  Toe ?  Toe location:  L big toe ?Pre-procedure details:  ?  Skin preparation:  Povidone-iodine ?Procedure type:  ?  Complexity:  Simple ?Procedure details:  ?  Incision types:  Single straight ?  Incision depth:  Dermal ?  Wound management:  Irrigated with saline ?  Drainage:  Serous ?  Drainage amount:  Copious ?  Wound treatment:  Wound left open ?  Packing materials:  None ?Post-procedure details:  ?  Procedure completion:  Tolerated well, no immediate complications  ? ? ?Medications Ordered in ED ?Medications  ?bacitracin ointment (2 application. Topical Given 10/25/21 1029)   ? ? ?ED Course/ Medical Decision Making/ A&P ?  ?                        ?Medical Decision Making ?Risk ?OTC drugs. ?Prescription drug management. ? ? ?51 year old male with complaint of blister on his left lower toe. ?No signs of infection.  We incised and drained the bulla and patient advised of wound management at home.  Discussed return precautions. ?Final Clinical Impression(s) / ED Diagnoses ?Final diagnoses:  ?Skin bulla  ? ? ?Rx / DC Orders ?ED Discharge Orders   ? ? None  ? ?  ? ? ?  ?Arthor Captain, PA-C ?10/25/21 1453 ? ?  ?Pricilla Loveless, MD ?10/25/21 1527 ? ?

## 2021-11-19 ENCOUNTER — Emergency Department (HOSPITAL_COMMUNITY): Payer: Medicaid Other

## 2021-11-19 ENCOUNTER — Inpatient Hospital Stay (HOSPITAL_COMMUNITY)
Admission: EM | Admit: 2021-11-19 | Discharge: 2021-11-21 | DRG: 368 | Disposition: A | Discharge status: Home / Self Care | Payer: Medicaid Other | Attending: Internal Medicine | Admitting: Internal Medicine

## 2021-11-19 ENCOUNTER — Other Ambulatory Visit: Payer: Self-pay

## 2021-11-19 ENCOUNTER — Encounter (HOSPITAL_COMMUNITY): Payer: Self-pay

## 2021-11-19 DIAGNOSIS — Z8249 Family history of ischemic heart disease and other diseases of the circulatory system: Secondary | ICD-10-CM

## 2021-11-19 DIAGNOSIS — F419 Anxiety disorder, unspecified: Secondary | ICD-10-CM | POA: Diagnosis present

## 2021-11-19 DIAGNOSIS — K920 Hematemesis: Principal | ICD-10-CM

## 2021-11-19 DIAGNOSIS — K226 Gastro-esophageal laceration-hemorrhage syndrome: Secondary | ICD-10-CM | POA: Diagnosis present

## 2021-11-19 DIAGNOSIS — F32A Depression, unspecified: Secondary | ICD-10-CM | POA: Diagnosis present

## 2021-11-19 DIAGNOSIS — K209 Esophagitis, unspecified without bleeding: Secondary | ICD-10-CM

## 2021-11-19 DIAGNOSIS — Z79899 Other long term (current) drug therapy: Secondary | ICD-10-CM

## 2021-11-19 DIAGNOSIS — K2211 Ulcer of esophagus with bleeding: Secondary | ICD-10-CM | POA: Diagnosis present

## 2021-11-19 DIAGNOSIS — F418 Other specified anxiety disorders: Secondary | ICD-10-CM | POA: Diagnosis present

## 2021-11-19 DIAGNOSIS — K760 Fatty (change of) liver, not elsewhere classified: Secondary | ICD-10-CM | POA: Diagnosis present

## 2021-11-19 DIAGNOSIS — F1010 Alcohol abuse, uncomplicated: Secondary | ICD-10-CM | POA: Diagnosis present

## 2021-11-19 DIAGNOSIS — K449 Diaphragmatic hernia without obstruction or gangrene: Secondary | ICD-10-CM | POA: Diagnosis present

## 2021-11-19 DIAGNOSIS — K922 Gastrointestinal hemorrhage, unspecified: Secondary | ICD-10-CM | POA: Diagnosis present

## 2021-11-19 DIAGNOSIS — K2101 Gastro-esophageal reflux disease with esophagitis, with bleeding: Principal | ICD-10-CM | POA: Diagnosis present

## 2021-11-19 HISTORY — DX: Alcohol abuse, uncomplicated: F10.10

## 2021-11-19 LAB — COMPREHENSIVE METABOLIC PANEL
ALT: 28 U/L (ref 0–44)
AST: 44 U/L — ABNORMAL HIGH (ref 15–41)
Albumin: 4.4 g/dL (ref 3.5–5.0)
Alkaline Phosphatase: 53 U/L (ref 38–126)
Anion gap: 10 (ref 5–15)
BUN: 12 mg/dL (ref 6–20)
CO2: 30 mmol/L (ref 22–32)
Calcium: 10.4 mg/dL — ABNORMAL HIGH (ref 8.9–10.3)
Chloride: 97 mmol/L — ABNORMAL LOW (ref 98–111)
Creatinine, Ser: 0.64 mg/dL (ref 0.61–1.24)
GFR, Estimated: >60 mL/min (ref >60)
Glucose, Bld: 128 mg/dL — ABNORMAL HIGH (ref 70–99)
Potassium: 3.7 mmol/L (ref 3.5–5.1)
Sodium: 137 mmol/L (ref 135–145)
Total Bilirubin: 1.4 mg/dL — ABNORMAL HIGH (ref 0.3–1.2)
Total Protein: 8.1 g/dL (ref 6.5–8.1)

## 2021-11-19 LAB — OCCULT BLOOD GASTRIC / DUODENUM (SPECIMEN CUP)
Occult Blood, Gastric: POSITIVE — AB
pH, Gastric: 3

## 2021-11-19 LAB — CBC WITH DIFFERENTIAL/PLATELET
Abs Immature Granulocytes: 0.02 10*3/uL (ref 0.00–0.07)
Basophils Absolute: 0 10*3/uL (ref 0.0–0.1)
Basophils Relative: 0 %
Eosinophils Absolute: 0 10*3/uL (ref 0.0–0.5)
Eosinophils Relative: 0 %
HCT: 45.7 % (ref 39.0–52.0)
Hemoglobin: 15.9 g/dL (ref 13.0–17.0)
Immature Granulocytes: 0 %
Lymphocytes Relative: 8 %
Lymphs Abs: 0.6 10*3/uL — ABNORMAL LOW (ref 0.7–4.0)
MCH: 33.2 pg (ref 26.0–34.0)
MCHC: 34.8 g/dL (ref 30.0–36.0)
MCV: 95.4 fL (ref 80.0–100.0)
Monocytes Absolute: 0.5 10*3/uL (ref 0.1–1.0)
Monocytes Relative: 7 %
Neutro Abs: 6.1 10*3/uL (ref 1.7–7.7)
Neutrophils Relative %: 85 %
Platelets: 237 10*3/uL (ref 150–400)
RBC: 4.79 MIL/uL (ref 4.22–5.81)
RDW: 13.8 % (ref 11.5–15.5)
WBC: 7.2 10*3/uL (ref 4.0–10.5)
nRBC: 0 % (ref 0.0–0.2)

## 2021-11-19 LAB — HEMOGLOBIN AND HEMATOCRIT, BLOOD
HCT: 42.9 % (ref 39.0–52.0)
HCT: 41.6 % (ref 39.0–52.0)
Hemoglobin: 14.3 g/dL (ref 13.0–17.0)
Hemoglobin: 14.3 g/dL (ref 13.0–17.0)

## 2021-11-19 LAB — MAGNESIUM: Magnesium: 2 mg/dL (ref 1.7–2.4)

## 2021-11-19 LAB — URINALYSIS, ROUTINE W REFLEX MICROSCOPIC
Bilirubin Urine: NEGATIVE
Glucose, UA: NEGATIVE mg/dL
Hgb urine dipstick: NEGATIVE
Ketones, ur: 20 mg/dL — AB
Leukocytes,Ua: NEGATIVE
Nitrite: NEGATIVE
Protein, ur: NEGATIVE mg/dL
Specific Gravity, Urine: 1.032 — ABNORMAL HIGH (ref 1.005–1.030)
pH: 8 (ref 5.0–8.0)

## 2021-11-19 LAB — POC OCCULT BLOOD, ED: Fecal Occult Bld: NEGATIVE

## 2021-11-19 LAB — ETHANOL: Alcohol, Ethyl (B): <10 mg/dL (ref <10)

## 2021-11-19 LAB — LIPASE, BLOOD: Lipase: 28 U/L (ref 11–51)

## 2021-11-19 MED ORDER — ORAL CARE MOUTH RINSE
15.0000 mL | Freq: Two times a day (BID) | OROMUCOSAL | Status: DC
  Administered 2021-11-19 – 2021-11-21 (×4): 15 mL via OROMUCOSAL

## 2021-11-19 MED ORDER — BUPROPION HCL ER (XL) 150 MG PO TB24
150.0000 mg | ORAL_TABLET | Freq: Two times a day (BID) | ORAL | Status: DC
  Administered 2021-11-19 – 2021-11-21 (×4): 150 mg via ORAL
  Filled 2021-11-19 (×4): qty 1

## 2021-11-19 MED ORDER — ONDANSETRON HCL 4 MG/2ML IJ SOLN: 4.0000 mg | Freq: Four times a day (QID) | INTRAMUSCULAR | Status: DC | PRN

## 2021-11-19 MED ORDER — ADULT MULTIVITAMIN W/MINERALS CH
1.0000 | ORAL_TABLET | Freq: Every day | ORAL | Status: DC
  Administered 2021-11-20 – 2021-11-21 (×2): 1 via ORAL
  Filled 2021-11-19 (×2): qty 1

## 2021-11-19 MED ORDER — MORPHINE SULFATE (PF) 4 MG/ML IV SOLN: 4.0000 mg | INTRAVENOUS | Status: DC | PRN

## 2021-11-19 MED ORDER — PANTOPRAZOLE SODIUM 40 MG IV SOLR
40.0000 mg | Freq: Once | INTRAVENOUS | Status: AC
  Administered 2021-11-19: 40 mg via INTRAVENOUS
  Filled 2021-11-19: qty 10

## 2021-11-19 MED ORDER — MORPHINE SULFATE (PF) 4 MG/ML IV SOLN
4.0000 mg | Freq: Once | INTRAVENOUS | Status: AC
  Administered 2021-11-19: 4 mg via INTRAVENOUS
  Filled 2021-11-19: qty 1

## 2021-11-19 MED ORDER — THIAMINE HCL 100 MG/ML IJ SOLN
100.0000 mg | Freq: Every day | INTRAMUSCULAR | Status: DC
  Administered 2021-11-19: 100 mg via INTRAVENOUS
  Filled 2021-11-19: qty 2

## 2021-11-19 MED ORDER — SODIUM CHLORIDE (PF) 0.9 % IJ SOLN
INTRAMUSCULAR | Status: AC
  Filled 2021-11-19: qty 50

## 2021-11-19 MED ORDER — OCTREOTIDE ACETATE 500 MCG/ML IJ SOLN
50.0000 ug/h | INTRAMUSCULAR | Status: DC
  Administered 2021-11-19: 50 ug/h via INTRAVENOUS
  Filled 2021-11-19: qty 1

## 2021-11-19 MED ORDER — LORAZEPAM 2 MG/ML IJ SOLN
1.0000 mg | INTRAMUSCULAR | Status: DC | PRN
  Filled 2021-11-19: qty 1

## 2021-11-19 MED ORDER — LORAZEPAM 2 MG/ML IJ SOLN
0.0000 mg | INTRAMUSCULAR | Status: DC
  Administered 2021-11-19: 1 mg via INTRAVENOUS
  Administered 2021-11-19: 2 mg via INTRAVENOUS
  Administered 2021-11-19: 1 mg via INTRAVENOUS
  Administered 2021-11-20 (×2): 2 mg via INTRAVENOUS
  Filled 2021-11-19 (×4): qty 1

## 2021-11-19 MED ORDER — PANTOPRAZOLE SODIUM 40 MG IV SOLR
40.0000 mg | Freq: Once | INTRAVENOUS | Status: AC
Start: 2021-11-19 — End: 2021-11-19
  Administered 2021-11-19: 40 mg via INTRAVENOUS
  Filled 2021-11-19: qty 10

## 2021-11-19 MED ORDER — GABAPENTIN 300 MG PO CAPS
300.0000 mg | ORAL_CAPSULE | Freq: Three times a day (TID) | ORAL | Status: DC
  Administered 2021-11-19 – 2021-11-21 (×6): 300 mg via ORAL
  Filled 2021-11-19 (×6): qty 1

## 2021-11-19 MED ORDER — PANTOPRAZOLE INFUSION (NEW) - SIMPLE MED
8.0000 mg/h | INTRAVENOUS | Status: DC
  Administered 2021-11-19 – 2021-11-21 (×5): 8 mg/h via INTRAVENOUS
  Filled 2021-11-19 (×2): qty 80
  Filled 2021-11-19: qty 100
  Filled 2021-11-19 (×2): qty 80
  Filled 2021-11-19: qty 100
  Filled 2021-11-19: qty 80

## 2021-11-19 MED ORDER — THIAMINE HCL 100 MG PO TABS
100.0000 mg | ORAL_TABLET | Freq: Every day | ORAL | Status: DC
  Administered 2021-11-20 – 2021-11-21 (×2): 100 mg via ORAL
  Filled 2021-11-19 (×2): qty 1

## 2021-11-19 MED ORDER — SODIUM CHLORIDE 0.9 % IV BOLUS
1000.0000 mL | Freq: Once | INTRAVENOUS | Status: AC
  Administered 2021-11-19: 1000 mL via INTRAVENOUS

## 2021-11-19 MED ORDER — FOLIC ACID 1 MG PO TABS
1.0000 mg | ORAL_TABLET | Freq: Every day | ORAL | Status: DC
  Administered 2021-11-19 – 2021-11-21 (×3): 1 mg via ORAL
  Filled 2021-11-19 (×3): qty 1

## 2021-11-19 MED ORDER — ONDANSETRON HCL 4 MG/2ML IJ SOLN
4.0000 mg | Freq: Once | INTRAMUSCULAR | Status: AC
  Administered 2021-11-19: 4 mg via INTRAVENOUS
  Filled 2021-11-19: qty 2

## 2021-11-19 MED ORDER — PANTOPRAZOLE SODIUM 40 MG IV SOLR
40.0000 mg | Freq: Two times a day (BID) | INTRAVENOUS | Status: DC
Start: 2021-11-23 — End: 2021-11-21

## 2021-11-19 MED ORDER — FINASTERIDE 1 MG PO TABS
1.0000 mg | ORAL_TABLET | Freq: Every day | ORAL | Status: DC
Start: 2021-11-20 — End: 2021-11-19

## 2021-11-19 MED ORDER — LORAZEPAM 1 MG PO TABS
1.0000 mg | ORAL_TABLET | ORAL | Status: DC | PRN
  Administered 2021-11-20: 1 mg via ORAL
  Filled 2021-11-19: qty 1

## 2021-11-19 MED ORDER — IOHEXOL 300 MG/ML  SOLN
75.0000 mL | Freq: Once | INTRAMUSCULAR | Status: AC | PRN
  Administered 2021-11-19: 75 mL via INTRAVENOUS

## 2021-11-19 MED ORDER — ONDANSETRON HCL 4 MG PO TABS: 4.0000 mg | ORAL_TABLET | Freq: Four times a day (QID) | ORAL | Status: DC | PRN

## 2021-11-19 MED ORDER — LORAZEPAM 2 MG/ML IJ SOLN: 0.0000 mg | Freq: Three times a day (TID) | INTRAMUSCULAR | Status: DC

## 2021-11-19 MED ORDER — LORAZEPAM 2 MG/ML IJ SOLN
1.0000 mg | Freq: Once | INTRAMUSCULAR | Status: AC
  Administered 2021-11-19: 1 mg via INTRAVENOUS
  Filled 2021-11-19: qty 1

## 2021-11-19 MED ORDER — LACTATED RINGERS IV SOLN: INTRAVENOUS | Status: DC

## 2021-11-19 MED ORDER — OCTREOTIDE LOAD VIA INFUSION
50.0000 ug | Freq: Once | INTRAVENOUS | Status: AC
  Administered 2021-11-19: 50 ug via INTRAVENOUS
  Filled 2021-11-19: qty 25

## 2021-11-19 MED ORDER — FINASTERIDE 1 MG PO TABS
1.0000 mg | ORAL_TABLET | Freq: Every day | ORAL | Status: DC
  Administered 2021-11-20 – 2021-11-21 (×2): 1 mg via ORAL

## 2021-11-19 NOTE — ED Notes (Signed)
Pt has one Large amount of dark brown/red/blood appearing emesis. EDP made aware

## 2021-11-19 NOTE — ED Triage Notes (Signed)
Patient reports that he has a known history of esophageal ulcers. Patient states he recently has been drinking alcohol x 2-3 days in a row.  Patient states he began vomiting blood last night and also has pain in his mid back. Patient states he has vomited blood x 4 total. Blood ws black in color.

## 2021-11-19 NOTE — Consult Note (Signed)
[facility] Consultation Note  Referring Provider: [facility] Primary Care Physician:  Norm Salt, PA  Reason for Consultation:  hematemesis  HPI: Tyler Trevino is a 51 y.o. male admitted for hematemesis.  Patient has history of alcohol abuse and was on an alcohol binge with his friends for the past couple days.  Had some hematemesis yesterday and today.  CT chest showed esophagitis but no varices.  He reports similar episode of hematemesis a few years ago in [place] and describes having endoscopy which showed esophagitis (we don't have any of these records).  Reports back pain for which he takes NSAIDs periodically.  Denies blood in stool.   Past Medical History:  Diagnosis Date   ADHD (attention deficit hyperactivity disorder)    Alcohol abuse 11/19/2021   Anxiety    Depression     History reviewed. No pertinent surgical history.  Prior to Admission medications   Medication Sig Start Date End Date Taking? Authorizing Provider  buPROPion (WELLBUTRIN XL) 150 MG 24 hr tablet Take 150 mg by mouth 2 (two) times daily.    Yes [provider]  finasteride (PROPECIA) 1 MG tablet Take 1 mg by mouth daily. 11/05/21  Yes [provider]  gabapentin (NEURONTIN) 300 MG capsule Take 300 mg by mouth 3 (three) times daily.   Yes [provider]  lisdexamfetamine (VYVANSE) 70 MG capsule Take 70 mg by mouth daily.   Yes [provider]  Multiple Vitamin (MULTIVITAMIN WITH MINERALS) TABS tablet Take 1 tablet by mouth daily.   Yes [provider]  pantoprazole (PROTONIX) 40 MG tablet Take 40 mg by mouth daily.   Yes [provider]  RETIN-A 0.05 % cream Apply 1 application. topically at bedtime. 10/22/21  Yes [provider]    Current Facility-Administered Medications  Medication Dose Route Frequency Provider Last Rate Last Admin   folic acid (FOLVITE) tablet 1 mg  1 mg Oral Daily Bobette Mo, MD        lactated ringers infusion   Intravenous Continuous Bobette Mo, MD 125 mL/hr at 11/19/21 1442 New Bag at 11/19/21 1442   LORazepam (ATIVAN) injection 0-4 mg  0-4 mg Intravenous Q4H Bobette Mo, MD   1 mg at 11/19/21 1453   Followed by   Melene Muller ON 11/21/2021] LORazepam (ATIVAN) injection 0-4 mg  0-4 mg Intravenous Q8H Bobette Mo, MD       LORazepam (ATIVAN) tablet 1-4 mg  1-4 mg Oral Q1H PRN Bobette Mo, MD       Or   LORazepam (ATIVAN) injection 1-4 mg  1-4 mg Intravenous Q1H PRN Bobette Mo, MD       multivitamin with minerals tablet 1 tablet  1 tablet Oral Daily Bobette Mo, MD       octreotide (SANDOSTATIN) 500 mcg in sodium chloride 0.9 % 250 mL (2 mcg/mL) infusion  50 mcg/hr Intravenous Continuous Bobette Mo, MD 25 mL/hr at 11/19/21 1522 50 mcg/hr at 11/19/21 1522   ondansetron (ZOFRAN) tablet 4 mg  4 mg Oral Q6H PRN Bobette Mo, MD       Or   ondansetron [facility]) injection 4 mg  4 mg Intravenous Q6H PRN Bobette Mo, MD       [START ON 11/23/2021] pantoprazole (PROTONIX) injection 40 mg  40 mg Intravenous Q12H Bobette Mo, MD       pantoprozole (PROTONIX) 80 mg /NS 100 mL infusion  8 mg/hr Intravenous Continuous  Bobette Mo, MD 10 mL/hr at 11/19/21 1443 8 mg/hr at 11/19/21 1443   thiamine tablet 100 mg  100 mg Oral Daily Bobette Mo, MD       Or   thiamine (B-1) injection 100 mg  100 mg Intravenous Daily Bobette Mo, MD   100 mg at 11/19/21 1459    Allergies as of 11/19/2021   (No Known Allergies)    Family History  Problem Relation Age of Onset   Cancer Mother    Heart disease Father     Social History   Socioeconomic History   Marital status: Single    Spouse name: Not on file   Number of children: Not on file   Years of education: Not on file   Highest education level: Not on file  Occupational History   Not on file  Tobacco Use   Smoking status: Never    Smokeless tobacco: Never  Vaping Use   Vaping Use: Never used  Substance and Sexual Activity   Alcohol use: Yes    Comment: occ   Drug use: No   Sexual activity: Not on file  Other Topics Concern   Not on file  Social History Narrative   Not on file   Social Determinants of Health   Financial Resource Strain: Not on file  Food Insecurity: Not on file  Transportation Needs: Not on file  Physical Activity: Not on file  Stress: Not on file  Social Connections: Not on file  Intimate Partner Violence: Not on file    Review of Systems: As per HPI, all others negative  Physical Exam: Vital signs in last 24 hours: Temp:  [98.1 F (36.7 C)-98.5 F (36.9 C)] 98.2 F (36.8 C) (05/21 1427) Pulse Rate:  [75-90] 75 (05/21 1427) Resp:  [16-20] 18 (05/21 1427) BP: (125-152)/(85-108) 125/85 (05/21 1427) SpO2:  [96 %-100 %] 100 % (05/21 1427) Weight:  [77.1 kg] 77.1 kg (05/21 1021)   General:   Awake, rambling speech, muttering a lot, eventually can be redirected to answer some questions Head:  Normocephalic and atraumatic. Eyes:  Sclera clear, no icterus.   Conjunctiva pink. Ears:  Normal auditory acuity. Nose:  No deformity, discharge,  or lesions. Mouth:  No deformity or lesions.  Oropharynx pink & moist. Neck:  Supple; no masses or thyromegaly. Abdomen:  Soft, nontender and nondistended. No masses, hepatosplenomegaly or hernias noted. Normal bowel sounds, without guarding, and without rebound.     Msk:  Symmetrical without gross deformities. Normal posture. Pulses:  Normal pulses noted. Extremities:  Without clubbing or edema. Neurologic:  Alert and  oriented x4; somewhat tremulous, otherwise grossly normal neurologically. Skin:  Intact without significant lesions or rashes. Psych:  Alert and cooperative. Normal mood and affect.   Lab Results: Recent Labs    11/19/21 1058 11/19/21 1503  WBC 7.2  --   HGB 15.9 14.3  HCT 45.7 42.9  PLT 237  --    BMET Recent Labs     11/19/21 1058  NA 137  K 3.7  CL 97*  CO2 30  GLUCOSE 128*  BUN 12  CREATININE 0.64  CALCIUM 10.4*   LFT Recent Labs    11/19/21 1058  PROT 8.1  ALBUMIN 4.4  AST 44*  ALT 28  ALKPHOS 53  BILITOT 1.4*   PT/INR No results for input(s): LABPROT, INR in the last 72 hours.  Studies/Results: CT Chest W Contrast  Result Date: 11/19/2021 CLINICAL DATA:  History of esophageal  ulcers, concern for esophageal perforation. Patient reports drinking alcohol 2-3 days in a row with vomiting blood last night and pain in the mid back. EXAM: CT CHEST WITH CONTRAST TECHNIQUE: Multidetector CT imaging of the chest was performed during intravenous contrast administration. RADIATION DOSE REDUCTION: This exam was performed according to the departmental dose-optimization program which includes automated exposure control, adjustment of the mA and/or kV according to patient size and/or use of iterative reconstruction technique. CONTRAST:  75mL OMNIPAQUE IOHEXOL 300 MG/ML  SOLN COMPARISON:  Same day chest radiograph. FINDINGS: Cardiovascular: No significant vascular findings. Normal heart size. No pericardial effusion. Mediastinum/Nodes: The distal esophagus demonstrates a long segment of circumferential mural thickening and edema with surrounding fat stranding, likely representing esophagitis. There is a small hiatal hernia. No pneumomediastinum. No enlarged mediastinal, hilar, or axillary lymph nodes. Thyroid gland and trachea demonstrate no significant findings. Lungs/Pleura: There is minimal bibasilar atelectasis. No pleural effusion or pneumothorax. Upper Abdomen: The liver is diffusely hypoattenuating, consistent with hepatic steatosis. Musculoskeletal: No chest wall abnormality. No acute or significant osseous findings. IMPRESSION: Circumferential mural thickening and edema with surrounding fat stranding of the distal esophagus likely represents esophagitis, however malignancy cannot be excluded. No  pneumomediastinum. Electronically Signed   By: Romona Curlsyler  Litton M.D.   On: 11/19/2021 12:40   DG Chest Portable 1 View  Result Date: 11/19/2021 CLINICAL DATA:  Hematemesis.  Esophageal ulcer. EXAM: PORTABLE CHEST 1 VIEW COMPARISON:  01/10/2017 FINDINGS: The heart size and mediastinal contours are within normal limits. Both lungs are clear. No evidence of pneumomediastinum or pneumothorax. No definite free intraperitoneal air identified along the diaphragm. IMPRESSION: No active disease. Electronically Signed   By: Danae OrleansJohn A Stahl M.D.   On: 11/19/2021 11:05    Impression:   Hematemesis, recurrent past couple days.  Most consistent reflux esophagitis.  Perhaps Mallory-Weiss tear.  Not consistent with variceal bleeding, and no portal hypertension seen on labs (normal PLT) or CT scan. Abnormal CT chest, distal esophageal thickening.  No evidence of cirrhosis or portal hypertension on CT. Alcohol abuse. Rambling/muttering speech, suspect incipient alcohol withdrawal.    Plan:   PPI.  Stop octreotide. Clear liquid diet, NPO after midnight. Alcohol abstinence. Watch closely for alcohol withdrawal. Endoscopy possibly tomorrow, if he's not in alcohol withdrawal. [facility] will follow.  Patient not alert enough today to provide consent for procedure.   LOS: 0 days   Deeksha Cotrell M  11/19/2021, 3:47 PM  Cell [phone] If no answer or after 5 PM call [phone]

## 2021-11-19 NOTE — ED Provider Notes (Signed)
[facility] DEPT Provider Note   CSN: [id number] Arrival date & time: 11/19/21  1011     History  Chief Complaint  Patient presents with   Hematemesis    Tyler Trevino is a 51 y.o. male.  The history is provided by the patient and medical records. No language interpreter was used.   51 year old male with significant history of anxiety, depression, alcohol abuse, known history of esophageal ulcer presenting complaining of vomiting blood.  Patient report for the past several days he has been on a binge drink and drink alcohol heavily.  Last night he developed acute onset of sharp burning pain to his midsternal radiates towards his back.  Felt nauseous, and has had vomiting coffee-ground emesis mixed with blood.  States that after vomiting he felt little bit better but symptoms still persist.  Nausea persists.  He mention being diagnosed with esophageal ulcers in the past without any specific treatment.  Use marijuana on occasion.  He denies other illicit drug use.  He denies fever lightheadedness dizziness shortness of breath abdominal pain or change in bowel movement.  Reported normal color stool last night.  Home Medications Prior to Admission medications   Medication Sig Start Date End Date Taking? Authorizing Provider  buPROPion (WELLBUTRIN XL) 150 MG 24 hr tablet Take 150 mg by mouth 2 (two) times daily.     [provider]  gabapentin (NEURONTIN) 300 MG capsule Take 300 mg by mouth 3 (three) times daily.    [provider]  lisdexamfetamine (VYVANSE) 70 MG capsule Take 70 mg by mouth daily.    [provider]  Multiple Vitamin (MULTIVITAMIN WITH MINERALS) TABS tablet Take 1 tablet by mouth daily.    [provider]  pantoprazole (PROTONIX) 40 MG tablet Take 40 mg by mouth daily.    [provider]  propranolol (INDERAL) 10 MG tablet Take 10 mg by mouth daily as needed (anxiety).    [provider]      Allergies    Patient has no known allergies.    Review of Systems   Review of Systems  All other systems reviewed and are negative.  Physical Exam Updated Vital Signs BP (!) 142/108 (BP Location: Right Arm)   Pulse 85   Temp 98.1 F (36.7 C) (Oral)   Resp 18   Ht 6' (1.829 m)   Wt 77.1 kg   SpO2 100%   BMI 23.06 kg/m  Physical Exam Vitals and nursing note reviewed.  Constitutional:      General: He is not in acute distress.    Appearance: He is well-developed.  HENT:     Head: Atraumatic.     Mouth/Throat:     Mouth: Mucous membranes are moist.  Eyes:     Conjunctiva/sclera: Conjunctivae normal.  Cardiovascular:     Rate and Rhythm: Tachycardia present.  Pulmonary:     Effort: Pulmonary effort is normal.     Breath sounds: Normal breath sounds. No wheezing, rhonchi or rales.  Abdominal:     Palpations: Abdomen is soft.     Tenderness: There is no abdominal tenderness.  Musculoskeletal:     Cervical back: Neck supple.  Skin:    Findings: No rash.  Neurological:     Mental Status: He is alert and oriented to person, place, and time.    ED Results / Procedures / Treatments   Labs (all labs ordered are listed, but only abnormal results are displayed) Labs Reviewed  CBC WITH DIFFERENTIAL/PLATELET - Abnormal; Notable for the following components:      Result Value   Lymphs Abs 0.6 (*)    All other components within normal limits  COMPREHENSIVE METABOLIC PANEL - Abnormal; Notable for the following components:   Chloride 97 (*)    Glucose, Bld 128 (*)    Calcium 10.4 (*)    AST 44 (*)    Total Bilirubin 1.4 (*)    All other components within normal limits  URINALYSIS, ROUTINE W REFLEX MICROSCOPIC - Abnormal; Notable for the following components:   APPearance CLOUDY (*)    Specific Gravity, Urine 1.032 (*)    Ketones, ur 20 (*)    All other components within normal limits  OCCULT BLOOD GASTRIC / DUODENUM (SPECIMEN CUP) - Abnormal; Notable for the  following components:   Occult Blood, Gastric POSITIVE (*)    All other components within normal limits  LIPASE, BLOOD  POC OCCULT BLOOD, ED    EKG None  Radiology CT Chest W Contrast  Result Date: 11/19/2021 CLINICAL DATA:  History of esophageal ulcers, concern for esophageal perforation. Patient reports drinking alcohol 2-3 days in a row with vomiting blood last night and pain in the mid back. EXAM: CT CHEST WITH CONTRAST TECHNIQUE: Multidetector CT imaging of the chest was performed during intravenous contrast administration. RADIATION DOSE REDUCTION: This exam was performed according to the departmental dose-optimization program which includes automated exposure control, adjustment of the mA and/or kV according to patient size and/or use of iterative reconstruction technique. CONTRAST:  35mL OMNIPAQUE IOHEXOL 300 MG/ML  SOLN COMPARISON:  Same day chest radiograph. FINDINGS: Cardiovascular: No significant vascular findings. Normal heart size. No pericardial effusion. Mediastinum/Nodes: The distal esophagus demonstrates a long segment of circumferential mural thickening and edema with surrounding fat stranding, likely representing esophagitis. There is a small hiatal hernia. No pneumomediastinum. No enlarged mediastinal, hilar, or axillary lymph nodes. Thyroid gland and trachea demonstrate no significant findings. Lungs/Pleura: There is minimal bibasilar atelectasis. No pleural effusion or pneumothorax. Upper Abdomen: The liver is diffusely hypoattenuating, consistent with hepatic steatosis. Musculoskeletal: No chest wall abnormality. No acute or significant osseous findings. IMPRESSION: Circumferential mural thickening and edema with surrounding fat stranding of the distal esophagus likely represents esophagitis, however malignancy cannot be excluded. No pneumomediastinum. Electronically Signed   By: Zerita Boers M.D.   On: 11/19/2021 12:40   DG Chest Portable 1 View  Result Date:  11/19/2021 CLINICAL DATA:  Hematemesis.  Esophageal ulcer. EXAM: PORTABLE CHEST 1 VIEW COMPARISON:  01/10/2017 FINDINGS: The heart size and mediastinal contours are within normal limits. Both lungs are clear. No evidence of pneumomediastinum or pneumothorax. No definite free intraperitoneal air identified along the diaphragm. IMPRESSION: No active disease. Electronically Signed   By: Marlaine Hind M.D.   On: 11/19/2021 11:05    Procedures Procedures    Medications Ordered in ED Medications  sodium chloride (PF) 0.9 % injection (has no administration in time range)  pantoprozole (PROTONIX) 80 mg /NS 100 mL infusion (has no administration in time range)  pantoprazole (PROTONIX) injection 40 mg (has no administration in time range)  pantoprazole (PROTONIX) injection 40 mg (has no administration in time range)  octreotide (SANDOSTATIN) 2 mcg/mL load via infusion 50 mcg (has no administration in time range)    And  octreotide (SANDOSTATIN) 500 mcg in sodium chloride 0.9 % 250 mL (2 mcg/mL) infusion (has no administration in time range)  sodium chloride 0.9 % bolus 1,000 mL (1,000 mLs Intravenous New Bag/Given  11/19/21 1057)  ondansetron (ZOFRAN) injection 4 mg (4 mg Intravenous Given 11/19/21 1108)  morphine (PF) 4 MG/ML injection 4 mg (4 mg Intravenous Given 11/19/21 1108)  pantoprazole (PROTONIX) injection 40 mg (40 mg Intravenous Given 11/19/21 1107)  LORazepam (ATIVAN) injection 1 mg (1 mg Intravenous Given 11/19/21 1228)  iohexol (OMNIPAQUE) 300 MG/ML solution 75 mL (75 mLs Intravenous Contrast Given 11/19/21 1216)    ED Course/ Medical Decision Making/ A&P                           Medical Decision Making Amount and/or Complexity of Data Reviewed Labs: ordered. Radiology: ordered.  Risk Prescription drug management.   BP (!) 142/108 (BP Location: Right Arm)   Pulse 85   Temp 98.1 F (36.7 C) (Oral)   Resp 18   Ht 6' (1.829 m)   Wt 77.1 kg   SpO2 100%   BMI 23.06 kg/m   10:46  AM This is a 51 year old male with history of alcohol abuse as well as history of esophageal ulcer who presents complaining of hematemesis that started last night.  He admits that he has been on a heavy alcohol binge for the past few days and last night he developed acute onset of sharp burning pain to his midsternal region radiates towards his back with nausea, vomiting, and vomited up some coffee-ground emesis.  States pain did improve however he voiced concerns about his symptoms and continues to endorse nausea.  On exam, patient is well-appearing, not actively vomiting, has a soft nontender abdomen.  His symptom however is concerning for potential esophageal ulcers less likely esophageal perforation.  Will initiate Protonix, give Zofran for nausea, morphine for pain as well as will provide IV fluid.  Portable chest x-ray ordered, labs ordered.  1:22 PM Labs and imaging obtained and independently viewed and interpreted by me.  I agree with radiologist interpretation.  Urinalysis without signs of urinary tract infection, fecal occult blood test is negative however Gastroccult test is positive.  Patient's hemoglobin is 15.9, electrolyte panels are reassuring, normal lipase, initial chest x-ray unremarkable, chest CT with contrast shows circumferential mural thickening and edema is with surrounding fat stranding with of the distal esophagus likely represents esophagitis however malignancy cannot be excluded.  No pneumomediastinum. In the room, patient proceeds to vomit approximately a cup of emesis with bright red blood .  Patient have received Protonix  I appreciate consultation from on-call GI specialist, Dr. Paulita Fujita who will see patient.  Will consult medicine for admission.  1:47 PM Appreciate consultation from [facility], Dr. Olevia Bowens, who agrees to see and will admit patient to stepdown.  At this time patient is hemodynamically stable and appears well rested.  However, he is at risk of  potential perforation and I believe stepdown is a reasonable floor for him.  We have discussed options of octreotide but I will defer further care to the admitting team.  No history of liver cirrhosis although CT scan did show evidence of hepatic steatosis.  This patient presents to the ED for concern of hematemesis, this involves an extensive number of treatment options, and is a complaint that carries with it a high risk of complications and morbidity.  The differential diagnosis includes PUD, gastritis, Malloryweiss tear, Boerhaave's, esophageal perforation, esophageal varices  Co morbidities that complicate the patient evaluation esophageal ulcer  Alcohol abuse Additional history obtained:  Additional history obtained from patient External records from outside source obtained and reviewed  including notes from hematologist due to pt having neutropenia in Feb  Lab Tests:  I Ordered, and personally interpreted labs.  The pertinent results include:  as above  Imaging Studies ordered:  I ordered imaging studies including chest CT I independently visualized and interpreted imaging which showed esophagitis I agree with the radiologist interpretation  Cardiac Monitoring:  The patient was maintained on a cardiac monitor.  I personally viewed and interpreted the cardiac monitored which showed an underlying rhythm of: NRS  Medicines ordered and prescription drug management:  I ordered medication including protonix  for esophagitis Reevaluation of the patient after these medicines showed that the patient improved I have reviewed the patients home medicines and have made adjustments as needed  Test Considered: as above  Critical Interventions: IV protonix  Zofran  IVF  Consultations Obtained:  I requested consultation with the gastroenterologist,  and discussed lab and imaging findings as well as pertinent plan - they recommend: hospital admission  Problem List / ED  Course: hematemesis  Reevaluation:  After the interventions noted above, I reevaluated the patient and found that they have :improved  Social Determinants of Health: no PCP  Dispostion:  After consideration of the diagnostic results and the patients response to treatment, I feel that the patent would benefit from admission.         Final Clinical Impression(s) / ED Diagnoses Final diagnoses:  Hematemesis with nausea  Esophagitis    Rx / DC Orders ED Discharge Orders     None         Domenic Moras, PA-C 11/19/21 1349    Lucrezia Starch, MD 11/19/21 1402

## 2021-11-19 NOTE — H&P (Signed)
History and Physical    Patient: Tyler Trevino [id number] DOB: September 09, 1970 DOA: 11/19/2021 DOS: the patient was seen and examined on 11/19/2021 PCP: Trey Sailors, PA  Patient coming from: Home  Chief Complaint:  Chief Complaint  Patient presents with   Hematemesis   HPI: Tyler Trevino is a 51 y.o. male with medical history significant of ADHD, anxiety, depression, alcohol abuse, esophageal ulcers who is coming to the emergency department with complaints of at least 4 episodes of of hematemesis since yesterday.  No melena or hematochezia.  No flank pain, dysuria, frequency or hematuria.  He had a similar picture while in [place], [place] about a year ago due to esophagitis.  He has been binging on alcohol.  He occasionally takes NSAIDs.  No fever, chills, sore throat, rhinorrhea, dyspnea, chest pain, palpitations, diaphoresis, PND, orthopnea or pitting edema lower extremities.  No polyuria, polydipsia, polyphagia or blurred vision.  ED course: Initial vital signs were temperature 98.1 F, pulse 85, respiration 18, BP 142/108 mmHg O2 sat 100% on room air.  The patient received 1000 mL of normal saline, 4 mg of ondansetron IVP, morphine 4 mg IVP, and 40 mg of Protonix IVP.  Lab work: Urinalysis cloudy with a mildly elevated specific gravity and ketonuria 20 mg/dL.  Gastric occult blood was positive.  Fecal occult blood was negative.  CBC with a white count 7.2, hemoglobin 15.9 g/dL platelets 237.  Normal PT, INR.  Alcohol less than 10 and magnesium 2.0 mg/dL.  CMP showed a chloride of 97 mmol/L, AST of 44 units/L, glucose of 128, creatinine 10.4, and total bilirubin 1.4 mg/dL.  The rest of the CMP measurements were unremarkable.  Lipase was normal.  Imaging: Portable 1 view chest radiograph was negative.  CT chest with contrast showed distal esophagus esophagitis, but unable to exclude malignancy.  Review of Systems: As mentioned in the history of present illness. All other systems  reviewed and are negative. Past Medical History:  Diagnosis Date   ADHD (attention deficit hyperactivity disorder)    Alcohol abuse 11/19/2021   Anxiety    Depression    History reviewed. No pertinent surgical history. Social History:  reports that he has never smoked. He has never used smokeless tobacco. He reports current alcohol use. He reports that he does not use drugs.  No Known Allergies  Family History  Problem Relation Age of Onset   Cancer Mother    Heart disease Father     Prior to Admission medications   Medication Sig Start Date End Date Taking? Authorizing Provider  buPROPion (WELLBUTRIN XL) 150 MG 24 hr tablet Take 150 mg by mouth 2 (two) times daily.    Yes [provider]  gabapentin (NEURONTIN) 300 MG capsule Take 300 mg by mouth 3 (three) times daily.   Yes [provider]  lisdexamfetamine (VYVANSE) 70 MG capsule Take 70 mg by mouth daily.   Yes [provider]  pantoprazole (PROTONIX) 40 MG tablet Take 40 mg by mouth daily.   Yes [provider]  propranolol (INDERAL) 10 MG tablet Take 10 mg by mouth daily as needed (anxiety).   Yes [provider]  Multiple Vitamin (MULTIVITAMIN WITH MINERALS) TABS tablet Take 1 tablet by mouth daily.    [provider]    Physical Exam: Vitals:   11/19/21 1230 11/19/21 1245 11/19/21 1300 11/19/21 1330  BP: 131/90 (!) 133/94 (!) 136/92 (!) 126/93  Pulse: 89 85 80 82  Resp: 16  17 18  Temp:      TempSrc:      SpO2: 98% 97% 98% 96%  Weight:      Height:       Physical Exam Vitals and nursing note reviewed.  HENT:     Head: Normocephalic.     Mouth/Throat:     Mouth: Mucous membranes are moist.  Eyes:     General: No scleral icterus.    Pupils: Pupils are equal, round, and reactive to light.  Neck:     Vascular: No JVD.  Cardiovascular:     Rate and Rhythm: Normal rate and regular rhythm.     Heart sounds: S1 normal and S2 normal.  Pulmonary:     Effort:  Pulmonary effort is normal.     Breath sounds: Normal breath sounds.  Abdominal:     General: Bowel sounds are normal. There is no distension.     Palpations: Abdomen is soft.     Tenderness: There is no abdominal tenderness. There is no guarding.  Musculoskeletal:     Cervical back: Neck supple.     Right lower leg: No edema.     Left lower leg: No edema.  Skin:    General: Skin is warm and dry.  Neurological:     General: No focal deficit present.     Mental Status: He is alert and oriented to person, place, and time.     Comments: Mildly sedated (reason IV lorazepam). Speech occasionally slurred.  Psychiatric:        Thought Content: Thought content normal.   Data Reviewed:  There are no new results to review at this time.  Assessment and Plan: Principal Problem:   UGI bleed Observation/PCU. Clear liquid diet per GI. Continue PPI infusion. Monitor hematocrit and hemoglobin. Transfuse as needed. Gastroenterology consult appreciated.  Active Problems:   Hypercalcemia Recheck calcium level in AM.    Hyperbilirubinemia In the setting of alcohol use. Follow-up level in the morning.    Alcohol abuse Preemptively started lorazepam by CIWA protocol. Folate, MVI and thiamine supplementation. Alcohol cessation advised. Consult TOC team.    Anxiety with depression Continue Wellbutrin 150 mg p.o. twice daily.    Advance Care Planning:   Code Status: Full Code   Consults:   Family Communication:   Severity of Illness: The appropriate patient status for this patient is OBSERVATION. Observation status is judged to be reasonable and necessary in order to provide the required intensity of service to ensure the patient's safety. The patient's presenting symptoms, physical exam findings, and initial radiographic and laboratory data in the context of their medical condition is felt to place them at decreased risk for further clinical deterioration. Furthermore, it is  anticipated that the patient will be medically stable for discharge from the hospital within 2 midnights of admission.   Author: Reubin Milan, MD 11/19/2021 1:55 PM  For on call review [email or web address].   This document was prepared in [employer] and may contain some unintended transcription errors.

## 2021-11-20 DIAGNOSIS — Z8249 Family history of ischemic heart disease and other diseases of the circulatory system: Secondary | ICD-10-CM | POA: Diagnosis not present

## 2021-11-20 DIAGNOSIS — F419 Anxiety disorder, unspecified: Secondary | ICD-10-CM | POA: Diagnosis present

## 2021-11-20 DIAGNOSIS — K2211 Ulcer of esophagus with bleeding: Secondary | ICD-10-CM | POA: Diagnosis present

## 2021-11-20 DIAGNOSIS — K2101 Gastro-esophageal reflux disease with esophagitis, with bleeding: Secondary | ICD-10-CM | POA: Diagnosis present

## 2021-11-20 DIAGNOSIS — K922 Gastrointestinal hemorrhage, unspecified: Secondary | ICD-10-CM | POA: Diagnosis not present

## 2021-11-20 DIAGNOSIS — F418 Other specified anxiety disorders: Secondary | ICD-10-CM | POA: Diagnosis not present

## 2021-11-20 DIAGNOSIS — K226 Gastro-esophageal laceration-hemorrhage syndrome: Secondary | ICD-10-CM | POA: Diagnosis present

## 2021-11-20 DIAGNOSIS — F1010 Alcohol abuse, uncomplicated: Secondary | ICD-10-CM | POA: Diagnosis present

## 2021-11-20 DIAGNOSIS — F32A Depression, unspecified: Secondary | ICD-10-CM | POA: Diagnosis present

## 2021-11-20 DIAGNOSIS — Z79899 Other long term (current) drug therapy: Secondary | ICD-10-CM | POA: Diagnosis not present

## 2021-11-20 DIAGNOSIS — K449 Diaphragmatic hernia without obstruction or gangrene: Secondary | ICD-10-CM | POA: Diagnosis present

## 2021-11-20 DIAGNOSIS — K760 Fatty (change of) liver, not elsewhere classified: Secondary | ICD-10-CM | POA: Diagnosis present

## 2021-11-20 DIAGNOSIS — K920 Hematemesis: Secondary | ICD-10-CM | POA: Diagnosis present

## 2021-11-20 LAB — COMPREHENSIVE METABOLIC PANEL
ALT: 21 U/L (ref 0–44)
AST: 30 U/L (ref 15–41)
Albumin: 3.6 g/dL (ref 3.5–5.0)
Alkaline Phosphatase: 47 U/L (ref 38–126)
Anion gap: 8 (ref 5–15)
BUN: 7 mg/dL (ref 6–20)
CO2: 28 mmol/L (ref 22–32)
Calcium: 8.8 mg/dL — ABNORMAL LOW (ref 8.9–10.3)
Chloride: 103 mmol/L (ref 98–111)
Creatinine, Ser: 0.66 mg/dL (ref 0.61–1.24)
GFR, Estimated: >60 mL/min (ref >60)
Glucose, Bld: 125 mg/dL — ABNORMAL HIGH (ref 70–99)
Potassium: 3.5 mmol/L (ref 3.5–5.1)
Sodium: 139 mmol/L (ref 135–145)
Total Bilirubin: 1.3 mg/dL — ABNORMAL HIGH (ref 0.3–1.2)
Total Protein: 6.4 g/dL — ABNORMAL LOW (ref 6.5–8.1)

## 2021-11-20 LAB — CBC
HCT: 40.8 % (ref 39.0–52.0)
Hemoglobin: 14 g/dL (ref 13.0–17.0)
MCH: 32.8 pg (ref 26.0–34.0)
MCHC: 34.3 g/dL (ref 30.0–36.0)
MCV: 95.6 fL (ref 80.0–100.0)
Platelets: 203 10*3/uL (ref 150–400)
RBC: 4.27 MIL/uL (ref 4.22–5.81)
RDW: 13.7 % (ref 11.5–15.5)
WBC: 6.1 10*3/uL (ref 4.0–10.5)
nRBC: 0 % (ref 0.0–0.2)

## 2021-11-20 MED ORDER — ACETAMINOPHEN 325 MG PO TABS
650.0000 mg | ORAL_TABLET | Freq: Four times a day (QID) | ORAL | Status: DC | PRN
  Administered 2021-11-20 (×2): 650 mg via ORAL
  Filled 2021-11-20 (×2): qty 2

## 2021-11-20 MED ORDER — POTASSIUM CHLORIDE CRYS ER 20 MEQ PO TBCR
40.0000 meq | EXTENDED_RELEASE_TABLET | Freq: Once | ORAL | Status: AC
  Administered 2021-11-20: 40 meq via ORAL
  Filled 2021-11-20: qty 2

## 2021-11-20 MED ORDER — MORPHINE SULFATE (PF) 2 MG/ML IV SOLN: 2.0000 mg | INTRAVENOUS | Status: DC | PRN

## 2021-11-20 NOTE — Progress Notes (Signed)
PROGRESS NOTE    Tyler Trevino  [id number] DOB: [birth year] DOA: 11/19/2021 PCP: Tyler Sailors, PA   Brief Narrative: 51 year old with past medical history significant for ADHD, anxiety, depression, alcohol abuse, esophageal ulcer who presents to the ED complaining of 4 episodes of hematemesis since the day of admission.  He denies melena or hematochezia.  He has been drinking alcohol.  Evaluation in the ED hemoglobin 15, gastric occult blood was positive.  Lipase and liver function test unremarkable.  CT chest with contrast showed distal esophagus esophagitis, but unable to exclude malignancy.   Assessment & Plan:   Principal Problem:   UGI bleed Active Problems:   Hypercalcemia   Hyperbilirubinemia   Alcohol abuse   Anxiety with depression   1-Upper GI bleed hematemesis: Could be related to esophagitis, vs Mallory-Weiss tear: -Continue with IV Protonix, IV fluids -GI planning endoscopy 5/23 -Encourage alcohol cessation. -Follow hemoglobin 14.   2-Hypercalcemia: Resolved. Treated with hydration   3-Hyperbilirubinemia: In the setting of alcohol use Follow trend.  Stable at 1.3  4-Alcohol abuse: CIWA score  this morning at 6. Continue with folic acid, thiamine IV fluids Continue with Ativan CIWA protocol  5-Anxiety, Depression: Continue with Wellbutrin    Estimated body mass index is 23.06 kg/m as calculated from the following:   Height as of this encounter: 6' (1.829 m).   Weight as of this encounter: 77.1 kg.   DVT prophylaxis: SCD Code Status: Full code Family Communication: Care discussed with patient.  Disposition Plan:  Status is: Observation The patient remains OBS appropriate and will d/c before 2 midnights.    Consultants:  GI  Procedures:  None  Antimicrobials:    Subjective: He was sleepy, wake up answer questions. Has mild tremor. No further vomiting. Complaining of back pain.   Objective: Vitals:   11/19/21 2202  11/20/21 0300 11/20/21 0958 11/20/21 1318  BP: 111/77 123/87 130/88 (!) 116/92  Pulse: 85 85 79 88  Resp: 18 (!) 22 (!) 22 (!) 22  Temp: 98.6 F (37 C) 98 F (36.7 C) 98.4 F (36.9 C) 98.3 F (36.8 C)  TempSrc: Oral Oral Oral Oral  SpO2: 93% 99% 96% 95%  Weight:      Height:        Intake/Output Summary (Last 24 hours) at 11/20/2021 1321 Last data filed at 11/20/2021 1140 Gross per 24 hour  Intake 2284.65 ml  Output 1900 ml  Net 384.65 ml   Filed Weights   11/19/21 1021  Weight: 77.1 kg    Examination:  General exam: Appears calm and comfortable  Respiratory system: Clear to auscultation. Respiratory effort normal. Cardiovascular system: S1 & S2 heard, RRR.  Gastrointestinal system: Abdomen is nondistended, soft and nontender. No organomegaly or masses felt. Normal bowel sounds heard. Central nervous system: Alert and oriented.  Extremities: Symmetric 5 x 5 power.   Data Reviewed: I have personally reviewed following labs and imaging studies  CBC: Recent Labs  Lab 11/19/21 1058 11/19/21 1503 11/19/21 2039 11/20/21 0332  WBC 7.2  --   --  6.1  NEUTROABS 6.1  --   --   --   HGB 15.9 14.3 14.3 14.0  HCT 45.7 42.9 41.6 40.8  MCV 95.4  --   --  95.6  PLT 237  --   --  123456   Basic Metabolic Panel: Recent Labs  Lab 11/19/21 1058 11/20/21 0332  NA 137 139  K 3.7 3.5  CL 97* 103  CO2 30 28  GLUCOSE 128* 125*  BUN 12 7  CREATININE 0.64 0.66  CALCIUM 10.4* 8.8*  MG 2.0  --    GFR: Estimated Creatinine Clearance: 121.8 mL/min (by C-G formula based on SCr of 0.66 mg/dL). Liver Function Tests: Recent Labs  Lab 11/19/21 1058 11/20/21 0332  AST 44* 30  ALT 28 21  ALKPHOS 53 47  BILITOT 1.4* 1.3*  PROT 8.1 6.4*  ALBUMIN 4.4 3.6   Recent Labs  Lab 11/19/21 1058  LIPASE 28   No results for input(s): AMMONIA in the last 168 hours. Coagulation Profile: No results for input(s): INR, PROTIME in the last 168 hours. Cardiac Enzymes: No results for  input(s): CKTOTAL, CKMB, CKMBINDEX, TROPONINI in the last 168 hours. BNP (last 3 results) No results for input(s): PROBNP in the last 8760 hours. HbA1C: No results for input(s): HGBA1C in the last 72 hours. CBG: No results for input(s): GLUCAP in the last 168 hours. Lipid Profile: No results for input(s): CHOL, HDL, LDLCALC, TRIG, CHOLHDL, LDLDIRECT in the last 72 hours. Thyroid Function Tests: No results for input(s): TSH, T4TOTAL, FREET4, T3FREE, THYROIDAB in the last 72 hours. Anemia Panel: No results for input(s): VITAMINB12, FOLATE, FERRITIN, TIBC, IRON, RETICCTPCT in the last 72 hours. Sepsis Labs: No results for input(s): PROCALCITON, LATICACIDVEN in the last 168 hours.  No results found for this or any previous visit (from the past 240 hour(s)).       Radiology Studies: CT Chest W Contrast  Result Date: 11/19/2021 CLINICAL DATA:  History of esophageal ulcers, concern for esophageal perforation. Patient reports drinking alcohol 2-3 days in a row with vomiting blood last night and pain in the mid back. EXAM: CT CHEST WITH CONTRAST TECHNIQUE: Multidetector CT imaging of the chest was performed during intravenous contrast administration. RADIATION DOSE REDUCTION: This exam was performed according to the departmental dose-optimization program which includes automated exposure control, adjustment of the mA and/or kV according to patient size and/or use of iterative reconstruction technique. CONTRAST:  59mL OMNIPAQUE IOHEXOL 300 MG/ML  SOLN COMPARISON:  Same day chest radiograph. FINDINGS: Cardiovascular: No significant vascular findings. Normal heart size. No pericardial effusion. Mediastinum/Nodes: The distal esophagus demonstrates a long segment of circumferential mural thickening and edema with surrounding fat stranding, likely representing esophagitis. There is a small hiatal hernia. No pneumomediastinum. No enlarged mediastinal, hilar, or axillary lymph nodes. Thyroid gland and  trachea demonstrate no significant findings. Lungs/Pleura: There is minimal bibasilar atelectasis. No pleural effusion or pneumothorax. Upper Abdomen: The liver is diffusely hypoattenuating, consistent with hepatic steatosis. Musculoskeletal: No chest wall abnormality. No acute or significant osseous findings. IMPRESSION: Circumferential mural thickening and edema with surrounding fat stranding of the distal esophagus likely represents esophagitis, however malignancy cannot be excluded. No pneumomediastinum. Electronically Signed   By: Romona Curls M.D.   On: 11/19/2021 12:40   DG Chest Portable 1 View  Result Date: 11/19/2021 CLINICAL DATA:  Hematemesis.  Esophageal ulcer. EXAM: PORTABLE CHEST 1 VIEW COMPARISON:  01/10/2017 FINDINGS: The heart size and mediastinal contours are within normal limits. Both lungs are clear. No evidence of pneumomediastinum or pneumothorax. No definite free intraperitoneal air identified along the diaphragm. IMPRESSION: No active disease. Electronically Signed   By: Danae Orleans M.D.   On: 11/19/2021 11:05        Scheduled Meds:  buPROPion  150 mg Oral BID   finasteride  1 mg Oral Daily   folic acid  1 mg Oral Daily   gabapentin  300 mg Oral TID  LORazepam  0-4 mg Intravenous Q4H   Followed by   Derrill Memo ON 11/21/2021] LORazepam  0-4 mg Intravenous Q8H   mouth rinse  15 mL Mouth Rinse BID   multivitamin with minerals  1 tablet Oral Daily   [START ON 11/23/2021] pantoprazole  40 mg Intravenous Q12H   thiamine  100 mg Oral Daily   Or   thiamine  100 mg Intravenous Daily   Continuous Infusions:  lactated ringers 125 mL/hr at 11/20/21 1316   pantoprazole 8 mg/hr (11/20/21 1027)     LOS: 0 days    Time spent: 35 minutes.     Elmarie Shiley, MD [facility]   If 7PM-7AM, please contact night-coverage [email or web address]  11/20/2021, 1:21 PM

## 2021-11-20 NOTE — TOC Initial Note (Signed)
Transition of Care [facility]) - Initial/Assessment Note    Patient Details  Name: Tyler Trevino MRN: [id number] Date of Birth: 1971-05-25  Transition of Care [facility]) CM/SW Contact:    Golda Acre, RN Phone Number: 11/20/2021, 7:52 AM  Clinical Narrative:                 Patient with hx of substance abuse mainly etoh.  Will need substance abuse resources when stable.  Otherwise following for toc needs.  Expected Discharge Plan: Home/Self Care Barriers to Discharge: No Barriers Identified   Patient Goals and CMS Choice Patient states their goals for this hospitalization and ongoing recovery are:: to goi back home CMS Medicare.gov Compare Post Acute Care list provided to:: Patient    Expected Discharge Plan and Services Expected Discharge Plan: Home/Self Care   Discharge Planning Services: CM Consult   Living arrangements for the past 2 months: Single Family Home                                      Prior Living Arrangements/Services Living arrangements for the past 2 months: Single Family Home Lives with:: Self Patient language and need for interpreter reviewed:: Yes Do you feel safe going back to the place where you live?: Yes            Criminal Activity/Legal Involvement Pertinent to Current Situation/Hospitalization: No - Comment as needed  Activities of Daily Living Home Assistive Devices/Equipment: None ADL Screening (condition at time of admission) Patient's cognitive ability adequate to safely complete daily activities?: Yes Is the patient deaf or have difficulty hearing?: No Does the patient have difficulty seeing, even when wearing glasses/contacts?: No Does the patient have difficulty concentrating, remembering, or making decisions?: No Patient able to express need for assistance with ADLs?: Yes Does the patient have difficulty dressing or bathing?: No Independently performs ADLs?: Yes (appropriate for developmental age) Does the patient have  difficulty walking or climbing stairs?: No Weakness of Legs: None Weakness of Arms/Hands: None  Permission Sought/Granted                  Emotional Assessment Appearance:: Appears stated age     Orientation: : Oriented to Self, Oriented to Place, Oriented to  Time, Oriented to Situation Alcohol / Substance Use: Alcohol Use Psych Involvement: No (comment)  Admission diagnosis:  UGI bleed [K92.2] Esophagitis [K20.90] Hematemesis with nausea [K92.0] Patient Active Problem List   Diagnosis Date Noted   UGI bleed 11/19/2021   Hypercalcemia 11/19/2021   Hyperbilirubinemia 11/19/2021   Alcohol abuse 11/19/2021   Anxiety with depression 11/19/2021   PCP:  Norm Salt, PA Pharmacy:   [facility] [phone] [place], [place] - [address] [place] [address] [place] [address] Phone: [phone] Fax: [phone]     Social Determinants of Health (SDOH) Interventions    Readmission Risk Interventions     View : No data to display.

## 2021-11-20 NOTE — Progress Notes (Signed)
[facility] Progress Note  Tyler Trevino 51 y.o. May 08, 1971   Subjective: Patient seen and examined laying comfortably in bed.  Patient is A&O x3.  Denies any further episodes of vomiting.  No bowel movement since admission.  Denies abdominal pain.  ROS : Review of Systems  Gastrointestinal:  Negative for abdominal pain, blood in stool, constipation, diarrhea, heartburn, melena, nausea and vomiting.  Genitourinary:  Negative for dysuria and urgency.     Objective: Vital signs in last 24 hours: Vitals:   11/19/21 2202 11/20/21 0300  BP: 111/77 123/87  Pulse: 85 85  Resp: 18 (!) 22  Temp: 98.6 F (37 C) 98 F (36.7 C)  SpO2: 93% 99%    Physical Exam:  General:  Alert, cooperative, no distress, appears stated age  Head:  Normocephalic, without obvious abnormality, atraumatic  Eyes:  Anicteric sclera, EOM's intact  Lungs:   Clear to auscultation bilaterally, respirations unlabored  Heart:  Regular rate and rhythm, S1, S2 normal  Abdomen:   Soft, non-tender, bowel sounds active all four quadrants,  no masses,   Extremities: Extremities normal, atraumatic, no  edema  Pulses: 2+ and symmetric    Lab Results: Recent Labs    11/19/21 1058 11/20/21 0332  NA 137 139  K 3.7 3.5  CL 97* 103  CO2 30 28  GLUCOSE 128* 125*  BUN 12 7  CREATININE 0.64 0.66  CALCIUM 10.4* 8.8*  MG 2.0  --    Recent Labs    11/19/21 1058 11/20/21 0332  AST 44* 30  ALT 28 21  ALKPHOS 53 47  BILITOT 1.4* 1.3*  PROT 8.1 6.4*  ALBUMIN 4.4 3.6   Recent Labs    11/19/21 1058 11/19/21 1503 11/19/21 2039 11/20/21 0332  WBC 7.2  --   --  6.1  NEUTROABS 6.1  --   --   --   HGB 15.9   < > 14.3 14.0  HCT 45.7   < > 41.6 40.8  MCV 95.4  --   --  95.6  PLT 237  --   --  203   < > = values in this interval not displayed.   No results for input(s): LABPROT, INR in the last 72 hours.    Assessment Hematemesis Alcohol abuse  Hgb stable today at 14.0. No Further episodes  of hematemesis.   Times consistent with reflux esophagitis patient has history of esophagitis prior to this admission.  Possible Mallory-Weiss tear after several days of drinking and multiple episodes of hematemesis.  The patient is A&O x3 discussed possible EGD patient is willing and able to consent.  CT chest with contrast 11/19/2021 Circumferential mural thickening and edema with surrounding fat stranding of the distal esophagus likely represents esophagitis, however malignancy cannot be excluded. No pneumomediastinum.  Plan: Plan for EGD tomorrow. I thoroughly discussed the procedures to include nature, alternatives, benefits, and risks including but not limited to bleeding, perforation, infection, anesthesia/cardiac and pulmonary complications. Patient provides understanding and gave verbal consent to proceed. Continue Protonix drip for total 72 hours. Continue clear liquid diet NPO at midnight Continue anti-emetics and supportive care as needed. [facility] will follow.     Roseanne Reno Drayden Lukas PA-C 11/20/2021, 9:33 AM  Contact #  [phone]

## 2021-11-21 ENCOUNTER — Encounter (HOSPITAL_COMMUNITY): Payer: Self-pay | Admitting: Internal Medicine

## 2021-11-21 ENCOUNTER — Inpatient Hospital Stay (HOSPITAL_COMMUNITY): Payer: Medicaid Other | Admitting: Anesthesiology

## 2021-11-21 ENCOUNTER — Encounter (HOSPITAL_COMMUNITY)
Admission: EM | Disposition: A | Discharge status: Home / Self Care | Payer: Self-pay | Source: Home / Self Care | Attending: Internal Medicine

## 2021-11-21 DIAGNOSIS — K449 Diaphragmatic hernia without obstruction or gangrene: Secondary | ICD-10-CM

## 2021-11-21 DIAGNOSIS — K2211 Ulcer of esophagus with bleeding: Secondary | ICD-10-CM

## 2021-11-21 DIAGNOSIS — F418 Other specified anxiety disorders: Secondary | ICD-10-CM

## 2021-11-21 DIAGNOSIS — K922 Gastrointestinal hemorrhage, unspecified: Secondary | ICD-10-CM | POA: Diagnosis not present

## 2021-11-21 HISTORY — PX: ESOPHAGOGASTRODUODENOSCOPY: SHX5428

## 2021-11-21 HISTORY — PX: BIOPSY: SHX5522

## 2021-11-21 SURGERY — EGD (ESOPHAGOGASTRODUODENOSCOPY)
Anesthesia: Monitor Anesthesia Care

## 2021-11-21 MED ORDER — PROPOFOL 10 MG/ML IV BOLUS
INTRAVENOUS | Status: DC | PRN
Start: 2021-11-21 — End: 2021-11-21
  Administered 2021-11-21 (×3): 100 mg via INTRAVENOUS

## 2021-11-21 MED ORDER — CHLORHEXIDINE GLUCONATE 0.12 % MT SOLN
15.0000 mL | Freq: Two times a day (BID) | OROMUCOSAL | Status: DC
  Administered 2021-11-21: 15 mL via OROMUCOSAL
  Filled 2021-11-21: qty 15

## 2021-11-21 MED ORDER — PANTOPRAZOLE SODIUM 40 MG PO TBEC: 40.0000 mg | DELAYED_RELEASE_TABLET | Freq: Two times a day (BID) | ORAL | 1 refills | Status: AC

## 2021-11-21 MED ORDER — PROPOFOL 1000 MG/100ML IV EMUL
INTRAVENOUS | Status: AC
  Filled 2021-11-21: qty 100

## 2021-11-21 MED ORDER — FOLIC ACID 1 MG PO TABS: 1.0000 mg | ORAL_TABLET | Freq: Every day | ORAL | 0 refills | Status: AC

## 2021-11-21 MED ORDER — THIAMINE HCL 100 MG PO TABS: 100.0000 mg | ORAL_TABLET | Freq: Every day | ORAL | 0 refills | Status: AC

## 2021-11-21 MED ORDER — DEXMEDETOMIDINE (PRECEDEX) IN NS 20 MCG/5ML (4 MCG/ML) IV SYRINGE
PREFILLED_SYRINGE | INTRAVENOUS | Status: AC
  Filled 2021-11-21: qty 5

## 2021-11-21 MED ORDER — DEXMEDETOMIDINE (PRECEDEX) IN NS 20 MCG/5ML (4 MCG/ML) IV SYRINGE
PREFILLED_SYRINGE | INTRAVENOUS | Status: DC | PRN
  Administered 2021-11-21: 12 ug via INTRAVENOUS
  Administered 2021-11-21: 8 ug via INTRAVENOUS

## 2021-11-21 MED ORDER — SODIUM CHLORIDE 0.9 % IV SOLN: INTRAVENOUS | Status: DC

## 2021-11-21 NOTE — Plan of Care (Signed)
  Problem: Education: Goal: Knowledge of General Education information will improve Description: Including pain rating scale, medication(s)/side effects and non-pharmacologic comfort measures Outcome: Progressing   Problem: Coping: Goal: Level of anxiety will decrease Outcome: Progressing   Problem: Safety: Goal: Ability to remain free from injury will improve Outcome: Progressing   

## 2021-11-21 NOTE — Discharge Instructions (Signed)

## 2021-11-21 NOTE — Anesthesia Preprocedure Evaluation (Signed)
Anesthesia Evaluation  Patient identified by MRN, date of birth, ID band Patient awake    Reviewed: Allergy & Precautions, NPO status , Patient's Chart, lab work & pertinent test results  Airway Mallampati: I       Dental no notable dental hx.    Pulmonary neg pulmonary ROS,    Pulmonary exam normal        Cardiovascular negative cardio ROS Normal cardiovascular exam     Neuro/Psych PSYCHIATRIC DISORDERS Anxiety Depression negative neurological ROS     GI/Hepatic Neg liver ROS, GERD  ,  Endo/Other  negative endocrine ROS  Renal/GU negative Renal ROS  negative genitourinary   Musculoskeletal negative musculoskeletal ROS (+)   Abdominal Normal abdominal exam  (+)   Peds negative pediatric ROS (+)  Hematology negative hematology ROS (+)   Anesthesia Other Findings   Reproductive/Obstetrics negative OB ROS                             Anesthesia Physical Anesthesia Plan  ASA: 2  Anesthesia Plan: MAC   Post-op Pain Management:    Induction:   PONV Risk Score and Plan: Propofol infusion and TIVA  Airway Management Planned: Natural Airway and Mask  Additional Equipment: None  Intra-op Plan:   Post-operative Plan:   Informed Consent: I have reviewed the patients History and Physical, chart, labs and discussed the procedure including the risks, benefits and alternatives for the proposed anesthesia with the patient or authorized representative who has indicated his/her understanding and acceptance.     Dental advisory given  Plan Discussed with: CRNA  Anesthesia Plan Comments:         Anesthesia Quick Evaluation

## 2021-11-21 NOTE — Anesthesia Postprocedure Evaluation (Signed)
Anesthesia Post Note  Patient: Tyler Trevino  Procedure(s) Performed: ESOPHAGOGASTRODUODENOSCOPY (EGD) BIOPSY     Patient location during evaluation: Endoscopy Anesthesia Type: MAC Level of consciousness: awake Pain management: pain level controlled Vital Signs Assessment: post-procedure vital signs reviewed and stable Respiratory status: spontaneous breathing Cardiovascular status: stable Postop Assessment: no apparent nausea or vomiting Anesthetic complications: no   No notable events documented.  Last Vitals:  Vitals:   11/21/21 1145 11/21/21 1148  BP:  126/90  Pulse: 74 74  Resp: 16 18  Temp:    SpO2: 97% 98%    Last Pain:  Vitals:   11/21/21 1148  TempSrc:   PainSc: 0-No pain                 Huston Foley

## 2021-11-21 NOTE — Progress Notes (Addendum)
Notified on call provider about patient's elevated BP of 138/101 around 2255. Reassessed patient and patient's CIWA score, and patient scored a 5 to get 1 mg of Ativan at 2257. Patient's BP was better this morning with a BP of 117/86.

## 2021-11-21 NOTE — Progress Notes (Signed)
Patient discharged home, discharge instructions given and explained to patient, he verbalized understanding, patient denies any pain/distress. No wound noted, accompanied home by aunt.

## 2021-11-21 NOTE — Op Note (Signed)
[facility] Patient Name: Tyler Trevino Procedure Date: 11/21/2021 MRN: [id number] Attending MD: Clarene Essex , MD Date of Birth: 1970/08/28 CSN: [id number] Age: 51 Admit Type: Ambulatory Procedure:                Upper GI endoscopy Indications:              Hematemesis, Abnormal CT of the GI tract Providers:                Clarene Essex, MD, Allayne Gitelman, RN, Despina Pole,                            Technician Referring MD:              Medicines:                Monitored Anesthesia Care Complications:            No immediate complications. Estimated Blood Loss:     Estimated blood loss: none. Procedure:                Pre-Anesthesia Assessment:                           - Prior to the procedure, a History and Physical                            was performed, and patient medications and                            allergies were reviewed. The patient's tolerance of                            previous anesthesia was also reviewed. The risks                            and benefits of the procedure and the sedation                            options and risks were discussed with the patient.                            All questions were answered, and informed consent                            was obtained. Prior Anticoagulants: The patient has                            taken no previous anticoagulant or antiplatelet                            agents. ASA Grade Assessment: II - A patient with                            mild systemic disease. After reviewing the risks  and benefits, the patient was deemed in                            satisfactory condition to undergo the procedure.                           After obtaining informed consent, the endoscope was                            passed under direct vision. Throughout the                            procedure, the patient's blood pressure, pulse, and                            oxygen  saturations were monitored continuously. The                            [id number] Olympus endoscope was introduced                            through the mouth, and advanced to the third part                            of duodenum. The upper GI endoscopy was                            accomplished without difficulty. The patient                            tolerated the procedure well. Scope In: Scope Out: Findings:      A small hiatal hernia was present.      Few linear and superficial esophageal ulcers in the distal esophagus       with no stigmata of recent bleeding were found. Biopsies were taken with       a cold forceps for histology.      The entire examined stomach was normal.      The duodenal bulb, first portion of the duodenum, second portion of the       duodenum and third portion of the duodenum were normal.      The exam was otherwise without abnormality. Impression:               - Small hiatal hernia.                           - Esophageal ulcers with no stigmata of recent                            bleeding. Biopsied. All in the distal esophagus                           - Normal stomach.                           - Normal duodenal bulb,  first portion of the                            duodenum, second portion of the duodenum and third                            portion of the duodenum.                           - The examination was otherwise normal. Moderate Sedation:      Not Applicable - Patient had care per Anesthesia. Recommendation:           - Soft diet today. Hopefully he can go home soon                           - Continue present medications. Discharged on twice                            a day pump inhibitors for 1 month and then decrease                            to once a day                           - Await pathology results.                           - Return to GI clinic in 6 weeks. And please call                            [place] sooner if we  could be of any further assistance                            or any GI question or problem- Telephone GI clinic                            for pathology results in 1 week.                           - Telephone GI clinic if symptomatic PRN. Procedure Code(s):        --- Professional ---                           [phone], Esophagogastroduodenoscopy, flexible,                            transoral; with biopsy, single or multiple Diagnosis Code(s):        --- Professional ---                           K44.9, Diaphragmatic hernia without obstruction or                            gangrene  K22.10, Ulcer of esophagus without bleeding                           K92.0, Hematemesis                           R93.3, Abnormal findings on diagnostic imaging of                            other parts of digestive tract CPT copyright 2019 American Medical Association. All rights reserved. The codes documented in this report are preliminary and upon coder review may  be revised to meet current compliance requirements. Clarene Essex, MD 11/21/2021 11:25:59 AM This report has been signed electronically. Number of Addenda: 0

## 2021-11-21 NOTE — Transfer of Care (Signed)
Immediate Anesthesia Transfer of Care Note  Patient: Tyler Trevino  Procedure(s) Performed: ESOPHAGOGASTRODUODENOSCOPY (EGD) BIOPSY  Patient Location: PACU  Anesthesia Type:MAC  Level of Consciousness: awake, alert , oriented and patient cooperative  Airway & Oxygen Therapy: Patient Spontanous Breathing  Post-op Assessment: Report given to RN, Post -op Vital signs reviewed and stable and Patient moving all extremities X 4  Post vital signs: Reviewed and stable  Last Vitals:  Vitals Value Taken Time  BP 131/88 11/21/21 1118  Temp 37 C 11/21/21 1118  Pulse 88 11/21/21 1118  Resp 22 11/21/21 1118  SpO2 96 % 11/21/21 1118    Last Pain:  Vitals:   11/21/21 1118  TempSrc: Temporal  PainSc:       Patients Stated Pain Goal: 0 (31/54/00 8676)  Complications: No notable events documented.

## 2021-11-21 NOTE — Discharge Summary (Signed)
Physician Discharge Summary   Patient: Tyler Trevino MRN: [id number] DOB: 06-03-1971  Admit date:     11/19/2021  Discharge date: 11/21/21  Discharge Physician: Alba Cory   PCP: Norm Salt, PA   Recommendations at discharge:    Needs to follow up with GI in 6 weeks Complete abstinence  from alcohol.  Follow up Biopsy results.   Discharge Diagnoses: Principal Problem:   UGI bleed Active Problems:   Hypercalcemia   Hyperbilirubinemia   Alcohol abuse   Anxiety with depression  Resolved Problems:   * No resolved hospital problems. *  Hospital Course: 51 year old with past medical history significant for ADHD, anxiety, depression, alcohol abuse, esophageal ulcer who presents to the ED complaining of 4 episodes of hematemesis since the day of admission.  He denies melena or hematochezia.  He has been drinking alcohol.   Evaluation in the ED hemoglobin 15, gastric occult blood was positive.  Lipase and liver function test unremarkable.  CT chest with contrast showed distal esophagus esophagitis, but unable to exclude malignancy.   Assessment and Plan:   1-Upper GI bleed hematemesis: Could be related to esophagitis, vs Mallory-Weiss tear: -Treated  with IV Protonix, IV fluids while in the hospital.  -GI Consulted, underwent endoscopy 5/23: showed few linear and superficial esophageal ulcers in the distal esophagus.   -Encourage alcohol cessation. - hemoglobin 14. Stable.  -Discharge on pantoprazole 40 mg BID for one month, then 40 mg daily.      2-Hypercalcemia: Resolved. Treated with hydration   3-Hyperbilirubinemia: In the setting of alcohol use Follow trend.  Stable at 1.3   4-Alcohol abuse: CIWA score  4 earlier this am.  Treated  with folic acid, thiamine IV fluids Treated  with Ativan CIWA protocol Stable for discharge.   5-Anxiety, Depression: Continue with Wellbutrin           Consultants: GI Procedures performed: Endoscopy  5/23 Disposition: Home Diet recommendation:  Discharge Diet Orders (From admission, onward)     Start     Ordered   11/21/21 0000  Diet - low sodium heart healthy        11/21/21 1307           Cardiac diet DISCHARGE MEDICATION: Allergies as of 11/21/2021   No Known Allergies      Medication List     TAKE these medications    buPROPion 150 MG 24 hr tablet Commonly known as: WELLBUTRIN XL Take 150 mg by mouth 2 (two) times daily.   finasteride 1 MG tablet Commonly known as: PROPECIA Take 1 mg by mouth daily.   folic acid 1 MG tablet Commonly known as: FOLVITE Take 1 tablet (1 mg total) by mouth daily. Start taking on: Nov 22, 2021   gabapentin 300 MG capsule Commonly known as: NEURONTIN Take 300 mg by mouth 3 (three) times daily.   lisdexamfetamine 70 MG capsule Commonly known as: VYVANSE Take 70 mg by mouth daily.   multivitamin with minerals Tabs tablet Take 1 tablet by mouth daily.   pantoprazole 40 MG tablet Commonly known as: Protonix Take 1 tablet (40 mg total) by mouth 2 (two) times daily. Take 40 mg BID for one then then take 40 mg daily. What changed:  when to take this additional instructions   Retin-A 0.05 % cream Generic drug: tretinoin Apply 1 application. topically at bedtime.   thiamine 100 MG tablet Take 1 tablet (100 mg total) by mouth daily. Start taking on: Nov 22, 2021  Discharge Exam: Filed Weights   11/19/21 1021 11/21/21 1011  Weight: 77.1 kg 77.1 kg   General; NAD Lung; CTA Abdomen; soft, nt  Condition at discharge: stable  The results of significant diagnostics from this hospitalization (including imaging, microbiology, ancillary and laboratory) are listed below for reference.   Imaging Studies: CT Chest W Contrast  Result Date: 11/19/2021 CLINICAL DATA:  History of esophageal ulcers, concern for esophageal perforation. Patient reports drinking alcohol 2-3 days in a row with vomiting blood last night and  pain in the mid back. EXAM: CT CHEST WITH CONTRAST TECHNIQUE: Multidetector CT imaging of the chest was performed during intravenous contrast administration. RADIATION DOSE REDUCTION: This exam was performed according to the departmental dose-optimization program which includes automated exposure control, adjustment of the mA and/or kV according to patient size and/or use of iterative reconstruction technique. CONTRAST:  46mL OMNIPAQUE IOHEXOL 300 MG/ML  SOLN COMPARISON:  Same day chest radiograph. FINDINGS: Cardiovascular: No significant vascular findings. Normal heart size. No pericardial effusion. Mediastinum/Nodes: The distal esophagus demonstrates a long segment of circumferential mural thickening and edema with surrounding fat stranding, likely representing esophagitis. There is a small hiatal hernia. No pneumomediastinum. No enlarged mediastinal, hilar, or axillary lymph nodes. Thyroid gland and trachea demonstrate no significant findings. Lungs/Pleura: There is minimal bibasilar atelectasis. No pleural effusion or pneumothorax. Upper Abdomen: The liver is diffusely hypoattenuating, consistent with hepatic steatosis. Musculoskeletal: No chest wall abnormality. No acute or significant osseous findings. IMPRESSION: Circumferential mural thickening and edema with surrounding fat stranding of the distal esophagus likely represents esophagitis, however malignancy cannot be excluded. No pneumomediastinum. Electronically Signed   By: Romona Curls M.D.   On: 11/19/2021 12:40   DG Chest Portable 1 View  Result Date: 11/19/2021 CLINICAL DATA:  Hematemesis.  Esophageal ulcer. EXAM: PORTABLE CHEST 1 VIEW COMPARISON:  01/10/2017 FINDINGS: The heart size and mediastinal contours are within normal limits. Both lungs are clear. No evidence of pneumomediastinum or pneumothorax. No definite free intraperitoneal air identified along the diaphragm. IMPRESSION: No active disease. Electronically Signed   By: Danae Orleans M.D.    On: 11/19/2021 11:05    Microbiology: Results for orders placed or performed during the hospital encounter of 07/14/20  SARS CORONAVIRUS 2 (TAT 6-24 HRS) Nasopharyngeal Nasopharyngeal Swab     Status: Abnormal   Collection Time: 07/14/20  6:06 PM   Specimen: Nasopharyngeal Swab  Result Value Ref Range Status   SARS Coronavirus 2 POSITIVE (A) NEGATIVE Final    Comment: (NOTE) SARS-CoV-2 target nucleic acids are DETECTED.  The SARS-CoV-2 RNA is generally detectable in upper and lower respiratory specimens during the acute phase of infection. Positive results are indicative of the presence of SARS-CoV-2 RNA. Clinical correlation with patient history and other diagnostic information is  necessary to determine patient infection status. Positive results do not rule out bacterial infection or co-infection with other viruses.  The expected result is Negative.  Fact Sheet for Patients: HairSlick.no  Fact Sheet for Healthcare Providers: quierodirigir.com  This test is not yet approved or cleared by the [place] FDA and  has been authorized for detection and/or diagnosis of SARS-CoV-2 by FDA under an Emergency Use Authorization (EUA). This EUA will remain  in effect (meaning this test can be used) for the duration of the COVID-19 declaration under Section 564(b)(1) of the Act, 21 U. S.C. section 360bbb-3(b)(1), unless the authorization is terminated or revoked sooner.   Performed at [facility], [address], [place], [place] [address]  Labs: CBC: Recent Labs  Lab 11/19/21 1058 11/19/21 1503 11/19/21 2039 11/20/21 0332  WBC 7.2  --   --  6.1  NEUTROABS 6.1  --   --   --   HGB 15.9 14.3 14.3 14.0  HCT 45.7 42.9 41.6 40.8  MCV 95.4  --   --  95.6  PLT 237  --   --  203   Basic Metabolic Panel: Recent Labs  Lab 11/19/21 1058 11/20/21 0332  NA 137 139  K 3.7 3.5  CL 97* 103  CO2 30 28  GLUCOSE  128* 125*  BUN 12 7  CREATININE 0.64 0.66  CALCIUM 10.4* 8.8*  MG 2.0  --    Liver Function Tests: Recent Labs  Lab 11/19/21 1058 11/20/21 0332  AST 44* 30  ALT 28 21  ALKPHOS 53 47  BILITOT 1.4* 1.3*  PROT 8.1 6.4*  ALBUMIN 4.4 3.6   CBG: No results for input(s): GLUCAP in the last 168 hours.  Discharge time spent: greater than 30 minutes.  Signed: Alba CoryBelkys A Jisela Merlino, MD [facility] 11/21/2021

## 2021-11-21 NOTE — Progress Notes (Signed)
Tyler Trevino 10:19 AM  Subjective: Patient doing better with decreased pain and no signs of bleeding and no new complaints and we discussed his alcohol issues  Objective: Vital signs stable afebrile exam please see preassessment evaluation no new lab  Assessment: Self-limited hematemesis abnormal CT  Plan: Okay to proceed with endoscopy with anesthesia assistance  [facility]  office [phone] After 5PM or if no answer call [phone]

## 2021-11-21 NOTE — TOC Transition Note (Signed)
Transition of Care [facility]) - CM/SW Discharge Note   Patient Details  Name: Tyler Trevino MRN: [id number] Date of Birth: September 03, 1970  [facility] CM/SW Contact:  Golda Acre, RN Phone Number: 11/21/2021, 1:10 PM   Clinical Narrative:    Dcd to home on po meds no toc needs.   Final next level of care: Home/Self Care Barriers to Discharge: No Barriers Identified   Patient Goals and CMS Choice Patient states their goals for this hospitalization and ongoing recovery are:: to goi back home CMS Medicare.gov Compare Post Acute Care list provided to:: Patient    Discharge Placement                       Discharge Plan and Services   Discharge Planning Services: CM Consult                                 Social Determinants of Health (SDOH) Interventions     Readmission Risk Interventions     View : No data to display.

## 2021-11-22 LAB — SURGICAL PATHOLOGY

## 2022-02-05 ENCOUNTER — Inpatient Hospital Stay: Payer: Medicaid Other

## 2022-02-05 ENCOUNTER — Inpatient Hospital Stay: Payer: Medicaid Other | Admitting: Hematology and Oncology

## 2022-03-14 ENCOUNTER — Inpatient Hospital Stay: Payer: Medicaid Other

## 2022-03-14 ENCOUNTER — Inpatient Hospital Stay: Payer: Medicaid Other | Admitting: Hematology and Oncology

## 2022-03-14 ENCOUNTER — Other Ambulatory Visit: Payer: Self-pay | Admitting: Hematology and Oncology

## 2022-03-14 DIAGNOSIS — D708 Other neutropenia: Secondary | ICD-10-CM

## 2022-03-22 ENCOUNTER — Inpatient Hospital Stay (HOSPITAL_BASED_OUTPATIENT_CLINIC_OR_DEPARTMENT_OTHER): Payer: Medicaid Other | Admitting: Hematology and Oncology

## 2022-03-22 ENCOUNTER — Inpatient Hospital Stay: Payer: Medicaid Other | Attending: Physician Assistant

## 2022-03-22 ENCOUNTER — Other Ambulatory Visit: Payer: Self-pay

## 2022-03-22 VITALS — BP 132/98 | HR 84 | Temp 98.1°F | Resp 15 | Wt 171.5 lb

## 2022-03-22 DIAGNOSIS — D72819 Decreased white blood cell count, unspecified: Secondary | ICD-10-CM | POA: Insufficient documentation

## 2022-03-22 DIAGNOSIS — Z79899 Other long term (current) drug therapy: Secondary | ICD-10-CM | POA: Insufficient documentation

## 2022-03-22 DIAGNOSIS — D708 Other neutropenia: Secondary | ICD-10-CM | POA: Diagnosis not present

## 2022-03-22 LAB — CBC WITH DIFFERENTIAL (CANCER CENTER ONLY)
Abs Immature Granulocytes: 0.01 10*3/uL (ref 0.00–0.07)
Basophils Absolute: 0 10*3/uL (ref 0.0–0.1)
Basophils Relative: 1 %
Eosinophils Absolute: 0.2 10*3/uL (ref 0.0–0.5)
Eosinophils Relative: 5 %
HCT: 42.7 % (ref 39.0–52.0)
Hemoglobin: 14.5 g/dL (ref 13.0–17.0)
Immature Granulocytes: 0 %
Lymphocytes Relative: 30 %
Lymphs Abs: 1.3 10*3/uL (ref 0.7–4.0)
MCH: 31.7 pg (ref 26.0–34.0)
MCHC: 34 g/dL (ref 30.0–36.0)
MCV: 93.2 fL (ref 80.0–100.0)
Monocytes Absolute: 0.6 10*3/uL (ref 0.1–1.0)
Monocytes Relative: 13 %
Neutro Abs: 2.2 10*3/uL (ref 1.7–7.7)
Neutrophils Relative %: 51 %
Platelet Count: 270 10*3/uL (ref 150–400)
RBC: 4.58 MIL/uL (ref 4.22–5.81)
RDW: 13.3 % (ref 11.5–15.5)
WBC Count: 4.2 10*3/uL (ref 4.0–10.5)
nRBC: 0 % (ref 0.0–0.2)

## 2022-03-22 LAB — FERRITIN: Ferritin: 26 ng/mL (ref 24–336)

## 2022-03-22 LAB — CMP (CANCER CENTER ONLY)
ALT: 10 U/L (ref 0–44)
AST: 26 U/L (ref 15–41)
Albumin: 4.3 g/dL (ref 3.5–5.0)
Alkaline Phosphatase: 43 U/L (ref 38–126)
Anion gap: 4 — ABNORMAL LOW (ref 5–15)
BUN: 12 mg/dL (ref 6–20)
CO2: 32 mmol/L (ref 22–32)
Calcium: 9.4 mg/dL (ref 8.9–10.3)
Chloride: 99 mmol/L (ref 98–111)
Creatinine: 0.7 mg/dL (ref 0.61–1.24)
GFR, Estimated: >60 mL/min (ref >60)
Glucose, Bld: 114 mg/dL — ABNORMAL HIGH (ref 70–99)
Potassium: 3.9 mmol/L (ref 3.5–5.1)
Sodium: 135 mmol/L (ref 135–145)
Total Bilirubin: 0.6 mg/dL (ref 0.3–1.2)
Total Protein: 6.9 g/dL (ref 6.5–8.1)

## 2022-03-22 LAB — IRON AND IRON BINDING CAPACITY (CC-WL,HP ONLY)
Iron: 92 ug/dL (ref 45–182)
Saturation Ratios: 25 % (ref 17.9–39.5)
TIBC: 363 ug/dL (ref 250–450)
UIBC: 271 ug/dL (ref 117–376)

## 2022-03-22 LAB — RETIC PANEL
Immature Retic Fract: 9.6 % (ref 2.3–15.9)
RBC.: 4.54 MIL/uL (ref 4.22–5.81)
Retic Count, Absolute: 41.8 10*3/uL (ref 19.0–186.0)
Retic Ct Pct: 0.9 % (ref 0.4–3.1)
Reticulocyte Hemoglobin: 34.5 pg (ref >27.9)

## 2022-03-22 NOTE — Progress Notes (Signed)
[place] [phone]   [phone]  PROGRESS NOTE  Patient Care Team: Trey Sailors, PA as PCP - General (Physician Assistant)  Hematological/Oncological History # Leukopenia 07/12/2021: WBC 2.2, Hgb 14.6, MCV 93.2, Plt 275, ANC 1016 08/03/2021: establish care with Dr. Lorenso Courier. WBC 3.3  03/22/2022: WBC 4.2, Hgb 14.5, Mcv 93.2, Plt 270  Interval History:  Tyler Trevino 51 y.o. male with medical history significant for leukopenia and recent GI bleeding who presents for a follow up visit. The patient's last visit was on 08/03/2021. In the interim since the last visit he was hospitalized from 5/21-/11/21/2021 with an upper GI bleed.  On exam today Mr. Blitch reports he is feeling better after his episode of ulcers in May 2023.  He notes it was an unexpected event that occurred when he was drinking beer and eating spicy pizza.  He reports the pizza spicy because he had an incidence of rupture.  He reports it has not happened since.  He does not have any issues with cramps or stomach upset.  He reports that he has been evaluated on Tuesday by [facility] for follow-up.  He is currently taking omeprazole and sulcal fate to help with his stomach.  He notes his energy levels are good and he is not having any lightheadedness or dizziness.  He denies any recent infection symptoms such as fevers, chills, sweats, nausea, vomiting or diarrhea.  Has had no dark stools.  A full 10 point ROS was otherwise negative.  MEDICAL HISTORY:  Past Medical History:  Diagnosis Date   ADHD (attention deficit hyperactivity disorder)    Alcohol abuse 11/19/2021   Anxiety    Depression     SURGICAL HISTORY: Past Surgical History:  Procedure Laterality Date   BIOPSY  11/21/2021   Procedure: BIOPSY;  Surgeon: Clarene Essex, MD;  Location: [facility] ENDOSCOPY;  Service: Gastroenterology;;   ESOPHAGOGASTRODUODENOSCOPY N/A 11/21/2021   Procedure: ESOPHAGOGASTRODUODENOSCOPY (EGD);  Surgeon:  Clarene Essex, MD;  Location: [facility];  Service: Gastroenterology;  Laterality: N/A;    SOCIAL HISTORY: Social History   Socioeconomic History   Marital status: Single    Spouse name: Not on file   Number of children: Not on file   Years of education: Not on file   Highest education level: Not on file  Occupational History   Not on file  Tobacco Use   Smoking status: Never   Smokeless tobacco: Never  Vaping Use   Vaping Use: Never used  Substance and Sexual Activity   Alcohol use: Yes    Comment: occ   Drug use: No   Sexual activity: Not on file  Other Topics Concern   Not on file  Social History Narrative   Not on file   Social Determinants of Health   Financial Resource Strain: Not on file  Food Insecurity: Not on file  Transportation Needs: Not on file  Physical Activity: Not on file  Stress: Not on file  Social Connections: Not on file  Intimate Partner Violence: Not on file    FAMILY HISTORY: Family History  Problem Relation Age of Onset   Cancer Mother    Heart disease Father     ALLERGIES:  has No Known Allergies.  MEDICATIONS:  Current Outpatient Medications  Medication Sig Dispense Refill   buPROPion (WELLBUTRIN XL) 150 MG 24 hr tablet Take 150 mg by mouth 2 (two) times daily.      finasteride (PROPECIA) 1 MG tablet Take 1 mg by  mouth daily.     gabapentin (NEURONTIN) 300 MG capsule Take 300 mg by mouth 3 (three) times daily.     lisdexamfetamine (VYVANSE) 70 MG capsule Take 70 mg by mouth daily. Taking 50 mg daily     Multiple Vitamin (MULTIVITAMIN WITH MINERALS) TABS tablet Take 1 tablet by mouth daily.     omeprazole (PRILOSEC) 20 MG capsule Take 20 mg by mouth daily.     RETIN-A 0.05 % cream Apply 1 application. topically at bedtime.     sucralfate (CARAFATE) 1 g tablet Take 1 g by mouth 4 (four) times daily.     folic acid (FOLVITE) 1 MG tablet Take 1 tablet (1 mg total) by mouth daily. 30 tablet 0   pantoprazole (PROTONIX) 40 MG tablet  Take 1 tablet (40 mg total) by mouth 2 (two) times daily. Take 40 mg BID for one then then take 40 mg daily. 60 tablet 1   thiamine 100 MG tablet Take 1 tablet (100 mg total) by mouth daily. 30 tablet 0   No current facility-administered medications for this visit.    REVIEW OF SYSTEMS:   Constitutional: ( - ) fevers, ( - )  chills , ( - ) night sweats Eyes: ( - ) blurriness of vision, ( - ) double vision, ( - ) watery eyes Ears, nose, mouth, throat, and face: ( - ) mucositis, ( - ) sore throat Respiratory: ( - ) cough, ( - ) dyspnea, ( - ) wheezes Cardiovascular: ( - ) palpitation, ( - ) chest discomfort, ( - ) lower extremity swelling Gastrointestinal:  ( - ) nausea, ( - ) heartburn, ( - ) change in bowel habits Skin: ( - ) abnormal skin rashes Lymphatics: ( - ) new lymphadenopathy, ( - ) easy bruising Neurological: ( - ) numbness, ( - ) tingling, ( - ) new weaknesses Behavioral/Psych: ( - ) mood change, ( - ) new changes  All other systems were reviewed with the patient and are negative.  PHYSICAL EXAMINATION:  Vitals:   03/22/22 0906  BP: (!) 132/98  Pulse: 84  Resp: 15  Temp: 98.1 F (36.7 C)  SpO2: 100%   Filed Weights   03/22/22 0906  Weight: 171 lb 8 oz (77.8 kg)    GENERAL: alert, no distress and comfortable SKIN: skin color, texture, turgor are normal, no rashes or significant lesions EYES: conjunctiva are pink and non-injected, sclera clear LUNGS: clear to auscultation and percussion with normal breathing effort HEART: regular rate & rhythm and no murmurs and no lower extremity edema Musculoskeletal: no cyanosis of digits and no clubbing  PSYCH: alert & oriented x 3, fluent speech NEURO: no focal motor/sensory deficits  LABORATORY DATA:  I have reviewed the data as listed    Latest Ref Rng & Units 03/22/2022    8:11 AM 11/20/2021    3:32 AM 11/19/2021    8:39 PM  CBC  WBC 4.0 - 10.5 K/uL 4.2  6.1    Hemoglobin 13.0 - 17.0 g/dL 14.5  14.0  14.3    Hematocrit 39.0 - 52.0 % 42.7  40.8  41.6   Platelets 150 - 400 K/uL 270  203         Latest Ref Rng & Units 03/22/2022    8:11 AM 11/20/2021    3:32 AM 11/19/2021   10:58 AM  CMP  Glucose 70 - 99 mg/dL 114  125  128   BUN 6 - 20 mg/dL 12  7  12   Creatinine 0.61 - 1.24 mg/dL 0.70  0.66  0.64   Sodium 135 - 145 mmol/L 135  139  137   Potassium 3.5 - 5.1 mmol/L 3.9  3.5  3.7   Chloride 98 - 111 mmol/L 99  103  97   CO2 22 - 32 mmol/L 32  28  30   Calcium 8.9 - 10.3 mg/dL 9.4  8.8  10.4   Total Protein 6.5 - 8.1 g/dL 6.9  6.4  8.1   Total Bilirubin 0.3 - 1.2 mg/dL 0.6  1.3  1.4   Alkaline Phos 38 - 126 U/L 43  47  53   AST 15 - 41 U/L 26  30  44   ALT 0 - 44 U/L 10  21  28      RADIOGRAPHIC STUDIES: No results found.  ASSESSMENT & PLAN Green Scherr Appelhans 51 y.o. male with medical history significant for leukopenia and recent GI bleeding who presents for a follow up visit.  After review of the labs, review of the records, and discussion with the patient the patients findings are most consistent with mild leukopenia of unclear etiology.  The bulk of our work-up focused on assuring that he does not have any other common causes of leukopenia.  We evaluated him for nutritional deficiency, viral etiology, and possible inflammatory conditions.  This is low white blood cell count is of a single timestamp and it is entirely possible that his most recent CBC was anomalous.     # Neutropenia, unclear etiology-resolved   --repeat CBC and CMP.  Labs today show white blood cell count 4.2, hemoglobin 14.5, MCV 93.2, and platelets of 270 --infectious serology testing with Hep B, Hep C, and HIV was negative.  --nutritional evaluation with Vitamin b12, folate showed no nutritional deficiencies. --RTC PRN  #GI Bleeding -- Hemoglobin today 14.5 with MCV 93.2.  Also order iron studies which are currently pending. -- Defer continued management to gastroenterology.  No orders of the defined types  were placed in this encounter.   All questions were answered. The patient knows to call the clinic with any problems, questions or concerns.  A total of more than 30 minutes were spent on this encounter with face-to-face time and non-face-to-face time, including preparing to see the patient, ordering tests and/or medications, counseling the patient and coordination of care as outlined above.   Ledell Peoples, MD Department of Hematology/Oncology [place] at [facility] Phone: [phone] Pager: [phone] Email: [email or web address]  03/22/2022 9:32 AM

## 2022-09-29 ENCOUNTER — Encounter (HOSPITAL_COMMUNITY): Payer: Self-pay

## 2022-09-29 ENCOUNTER — Emergency Department (HOSPITAL_COMMUNITY)
Admission: EM | Admit: 2022-09-29 | Discharge: 2022-09-29 | Disposition: A | Discharge status: Home / Self Care | Payer: Medicaid Other | Attending: Emergency Medicine | Admitting: Emergency Medicine

## 2022-09-29 DIAGNOSIS — Z20822 Contact with and (suspected) exposure to covid-19: Secondary | ICD-10-CM | POA: Diagnosis not present

## 2022-09-29 DIAGNOSIS — R0981 Nasal congestion: Secondary | ICD-10-CM | POA: Diagnosis present

## 2022-09-29 DIAGNOSIS — B349 Viral infection, unspecified: Secondary | ICD-10-CM | POA: Diagnosis not present

## 2022-09-29 LAB — RESP PANEL BY RT-PCR (RSV, FLU A&B, COVID)  RVPGX2
Influenza A by PCR: NEGATIVE
Influenza B by PCR: NEGATIVE
Resp Syncytial Virus by PCR: NEGATIVE
SARS Coronavirus 2 by RT PCR: NEGATIVE

## 2022-09-29 NOTE — ED Provider Notes (Signed)
[facility] Provider Note   CSN: [id number] Arrival date & time: 09/29/22  1029     History  Chief Complaint  Patient presents with   Generalized Body Aches    Tyler Trevino is a 52 y.o. male with past medical history ADHD, anxiety, depression who presents to the ED complaining of fatigue, generalized bodyaches, nasal congestion, and intermittent headaches for the last 6 days.  Patient reports that he was evaluated at his PCP 2 days ago and had a negative test for COVID and influenza.  He reports that overall he is recovering and he is no longer having headaches but was looking up his symptoms online and became concerned for meningitis and thus came to the ED for further evaluation.  Patient is not having fever, chills, neck pain, difficulty with range of motion of the neck, facial pain, nausea, vomiting, vision changes, chest pain, shortness of breath, or any other symptoms at this time.  He states that overall he feels like he is improving.  No recent antibiotic use.  No known sick contacts.    Home Medications Prior to Admission medications   Medication Sig Start Date End Date Taking? Authorizing Provider  buPROPion (WELLBUTRIN XL) 150 MG 24 hr tablet Take 150 mg by mouth 2 (two) times daily.     [provider]  finasteride (PROPECIA) 1 MG tablet Take 1 mg by mouth daily. 11/05/21   [provider]  folic acid (FOLVITE) 1 MG tablet Take 1 tablet (1 mg total) by mouth daily. 11/22/21   Regalado, Belkys A, MD  gabapentin (NEURONTIN) 300 MG capsule Take 300 mg by mouth 3 (three) times daily.    [provider]  lisdexamfetamine (VYVANSE) 70 MG capsule Take 70 mg by mouth daily. Taking 50 mg daily    [provider]  Multiple Vitamin (MULTIVITAMIN WITH MINERALS) TABS tablet Take 1 tablet by mouth daily.    [provider]  omeprazole (PRILOSEC) 20 MG capsule Take 20 mg by mouth daily. 03/03/22    [provider]  pantoprazole (PROTONIX) 40 MG tablet Take 1 tablet (40 mg total) by mouth 2 (two) times daily. Take 40 mg BID for one then then take 40 mg daily. 11/21/21 01/20/22  Regalado, Belkys A, MD  RETIN-A 0.05 % cream Apply 1 application. topically at bedtime. 10/22/21   [provider]  sucralfate (CARAFATE) 1 g tablet Take 1 g by mouth 4 (four) times daily. 03/04/22   [provider]  thiamine 100 MG tablet Take 1 tablet (100 mg total) by mouth daily. 11/22/21   Regalado, Cassie Freer, MD      Allergies    Patient has no known allergies.    Review of Systems   Review of Systems  All other systems reviewed and are negative.   Physical Exam Updated Vital Signs BP (!) 146/91 (BP Location: Left Arm)   Pulse 89   Temp 98.1 F (36.7 C) (Oral)   Resp 16   Ht 6' (1.829 m)   Wt 77.1 kg   SpO2 100%   BMI 23.06 kg/m  Physical Exam Vitals and nursing note reviewed.  Constitutional:      General: He is not in acute distress.    Appearance: Normal appearance. He is not ill-appearing, toxic-appearing or diaphoretic.  HENT:     Head: Normocephalic and atraumatic.     Nose: Congestion present.     Comments: No sinus tenderness  Mouth/Throat:     Mouth: Mucous membranes are moist.     Pharynx: Oropharynx is clear. Uvula midline. No pharyngeal swelling, oropharyngeal exudate, posterior oropharyngeal erythema or uvula swelling.     Tonsils: No tonsillar abscesses.  Eyes:     Extraocular Movements: Extraocular movements intact.     Conjunctiva/sclera: Conjunctivae normal.     Pupils: Pupils are equal, round, and reactive to light.  Neck:     Comments: No meningismus Cardiovascular:     Rate and Rhythm: Normal rate and regular rhythm.     Heart sounds: No murmur heard. Pulmonary:     Effort: Pulmonary effort is normal. No respiratory distress.     Breath sounds: Normal breath sounds. No stridor. No wheezing, rhonchi or rales.  Chest:     Chest wall: No  tenderness.  Abdominal:     General: Abdomen is flat.     Palpations: Abdomen is soft.     Tenderness: There is no abdominal tenderness.  Musculoskeletal:        General: Normal range of motion.     Cervical back: Normal range of motion and neck supple. No rigidity or tenderness.     Right lower leg: No edema.     Left lower leg: No edema.  Lymphadenopathy:     Cervical: No cervical adenopathy.  Skin:    General: Skin is warm and dry.     Capillary Refill: Capillary refill takes less than 2 seconds.     Findings: No rash.  Neurological:     General: No focal deficit present.     Mental Status: He is alert and oriented to person, place, and time.     GCS: GCS eye subscore is 4. GCS verbal subscore is 5. GCS motor subscore is 6.     Cranial Nerves: Cranial nerves 2-12 are intact.     Motor: Motor function is intact.  Psychiatric:        Mood and Affect: Mood normal.        Behavior: Behavior normal.     ED Results / Procedures / Treatments   Labs (all labs ordered are listed, but only abnormal results are displayed) Labs Reviewed  RESP PANEL BY RT-PCR (RSV, FLU A&B, COVID)  [id number]    EKG None  Radiology No results found.  Procedures Procedures    Medications Ordered in ED Medications - No data to display  ED Course/ Medical Decision Making/ A&P                             Medical Decision Making Amount and/or Complexity of Data Reviewed Labs: ordered. Decision-making details documented in ED Course.   Medical Decision Making:   [name] is a 52 y.o. male who presented to the ED today with fatigue, body aches detailed above.    Patient's presentation is complicated by their history of anxiety.  Complete initial physical exam performed, notably the patient  was in no acute distress. Lungs clear to auscultation. No sinus tenderness. No meningismus. Mild bilateral nasal congestion. Pt nontoxic appearing. Reassuring vital signs. No tachycardia. Normal  oropharyngeal exam as documented. Neurologically intact.  Reviewed and confirmed nursing documentation for past medical history, family history, social history.    Initial Assessment:   With the patient's presentation of body aches, fatigue, most likely diagnosis is viral illness. Differential diagnosis includes but is not limited to COVID, influenza, RSV, pneumonia, meningitis, sinusitis, strep pharyngitis, bronchitis.  This is most consistent with an acute complicated illness  Initial Plan:  Viral swabs Discussed option of delayed antibiotic prescription for treatment of acute bacterial sinusitis if patient continues to have symptoms but he declines this stating that his headaches, facial pain have resolved and overall his symptoms are improving and as he does not desire this as long as his physical exam and viral swabs are reassuring today Objective evaluation as reviewed   Initial Study Results:   Laboratory  All laboratory results reviewed without evidence of clinically relevant pathology.     Final Assessment and Plan:   This is a 52 year old male who presents to the ED with generalized bodyaches, fatigue, and other upper respiratory symptoms as above.  Patient reports that he had a negative COVID and flu swab at PCP this past week.  He reports that overall symptoms are generally improving but he was googling his symptoms and became concerned for meningitis and thus came to the ED for further assessment.  Patient has not been febrile.  He does not have any severe neck pain or problems with range of motion of the neck.  On exam, patient has nasal congestion, no sinus tenderness, lungs clear to auscultation, normal pharyngeal exam, no meningismus patient nontoxic-appearing.  Overall benign physical exam.  Viral swabs negative.  Patient declined antibiotic prescription for treatment of acute bacterial sinusitis should his symptoms continue.  Patient given strict ED return precautions, all  questions answered, and stable for discharge.   Clinical Impression:  1. Viral syndrome      Discharge           Final Clinical Impression(s) / ED Diagnoses Final diagnoses:  Viral syndrome    Rx / DC Orders ED Discharge Orders     None         Suzzette Righter, PA-C 09/29/22 1215    Regan Lemming, MD 09/30/22 1807

## 2022-09-29 NOTE — ED Triage Notes (Signed)
Pt states that last Sunday, he had congestion. On Monday, pt c/o headache, generalized body aches, sore throat. Pt states he took COVID test Thursday, but was negative. Pt states his symptoms have gotten better, but was concerned for what it could be.

## 2022-09-29 NOTE — Discharge Instructions (Signed)
Your viral swabs were negative for COVID, flu, and RSV. Overall, your exam was reassuring. Please see instructions below for caring for yourself while dealing with a viral illness. If you develop any new or worsening symptoms such as high fevers, severe neck pain, inability to move your neck, chest pain, shortness of breath, or other new, concerning symptoms, please return to nearest emergency department for re-evaluation.   Viral Illness TREATMENT  Treatment is directed at relieving symptoms. There is no cure. Antibiotics are not effective because the infection is caused by a virus, not by bacteria. Treatment may include:  Increased fluid intake. Sports drinks offer valuable electrolytes, sugars, and fluids.  Breathing heated mist or steam (vaporizer or shower).  Eating chicken soup or other clear broths, and maintaining good nutrition.  Getting plenty of rest.  Using gargles or lozenges for comfort.  Increasing usage of your inhaler if you have asthma.  Return to work when your temperature has returned to normal.  Gargle warm salt water and spit it out for sore throat. Take benadryl to decrease sinus secretions. Continue to alternate between Tylenol and ibuprofen for pain and fever control.  Follow Up: Follow up with your primary care doctor in 5-7 days for recheck of ongoing symptoms.  Return to emergency department for emergent changing or worsening of symptoms.

## 2023-09-16 IMAGING — CT CT CHEST W/ CM
2 of 3 series · 15 of 36 positions shown, 18 images · IV contrast (agent unspecified)
Comparison: Same day chest radiograph.

CLINICAL DATA: History of esophageal ulcers, concern for esophageal
perforation. Patient reports drinking alcohol 2-3 days in a row with
vomiting blood last night and pain in the mid back.

EXAM:
CT CHEST WITH CONTRAST
TECHNIQUE: Multidetector CT imaging of the chest was performed during
intravenous contrast administration.

[Series 2: axial st · axial · 0.73mm/px · z∈[-325,-13]mm · 12 of 184 slices shown, 15 images]
[im 14/184  mediastinal]
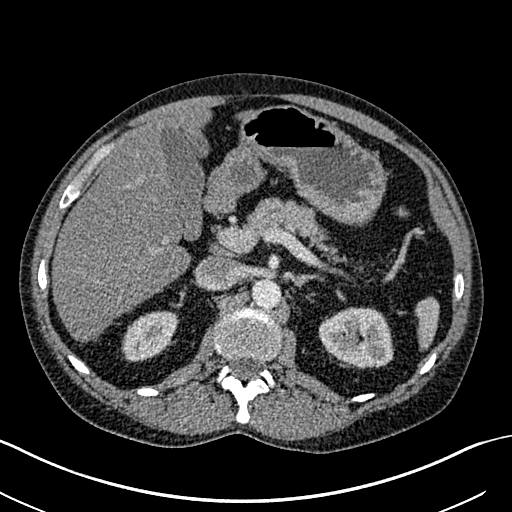
[im 14/184  lung]
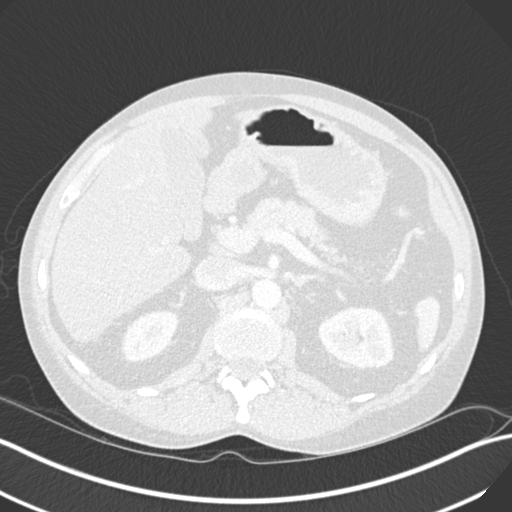
[im 28/184  lung]
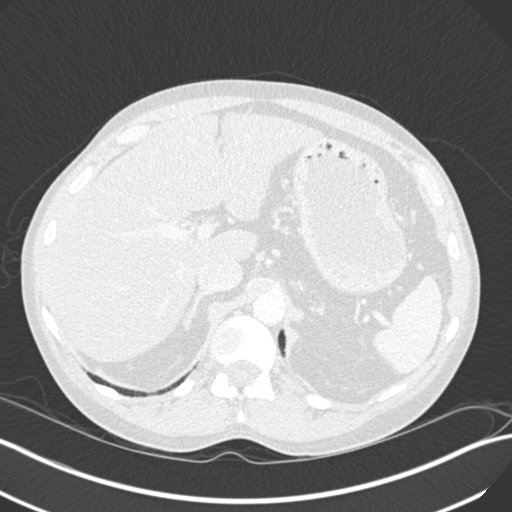
[im 41/184  lung]
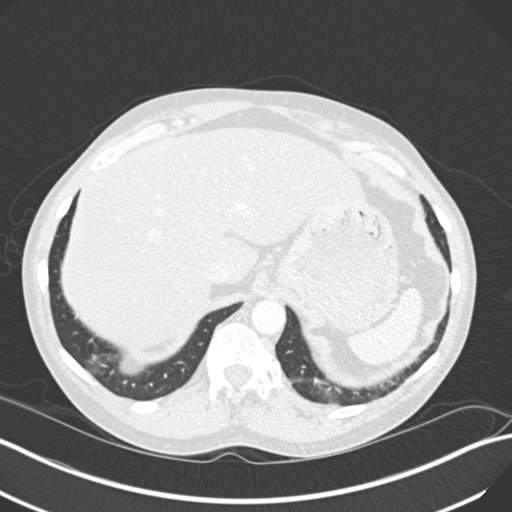
[im 55/184  lung]
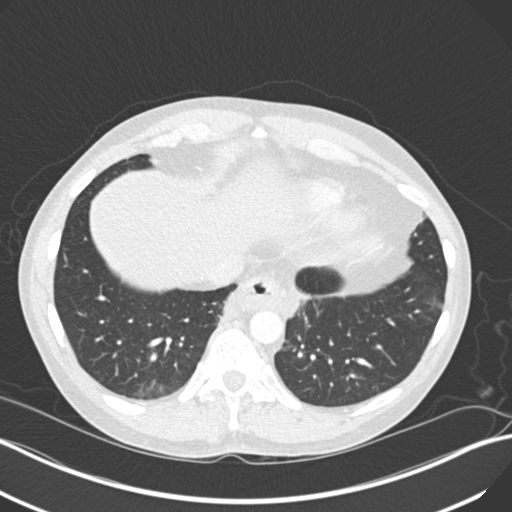
[im 68/184  mediastinal]
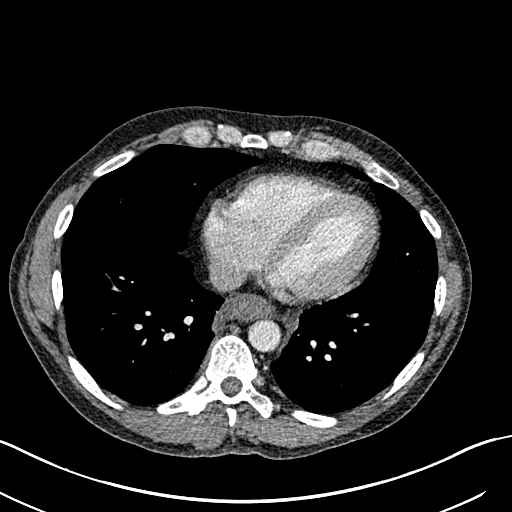
[im 68/184  lung]
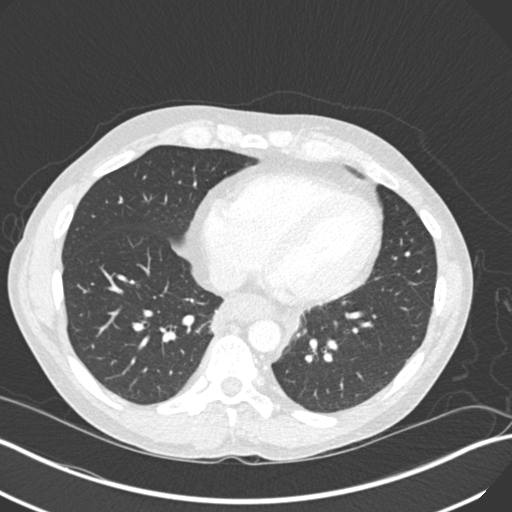
[im 82/184  lung]
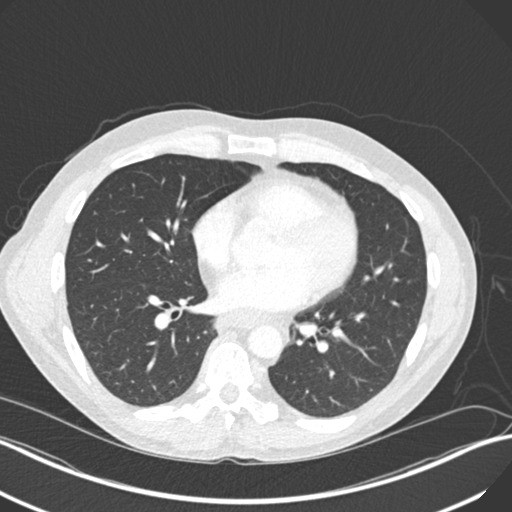
[im 102/184  lung]
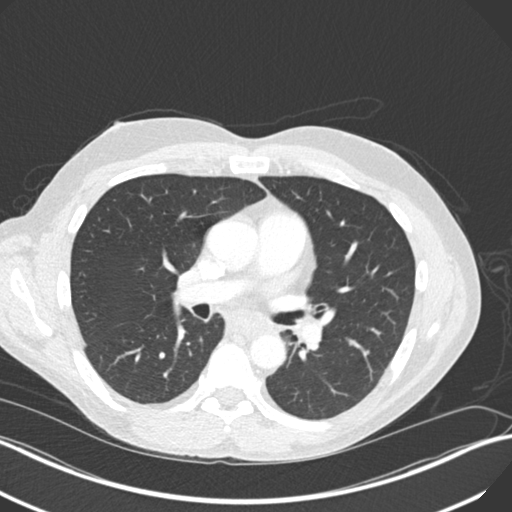
[im 116/184  lung]
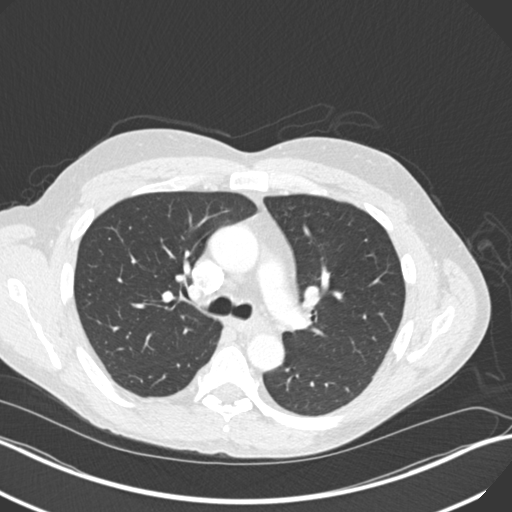
[im 129/184  mediastinal]
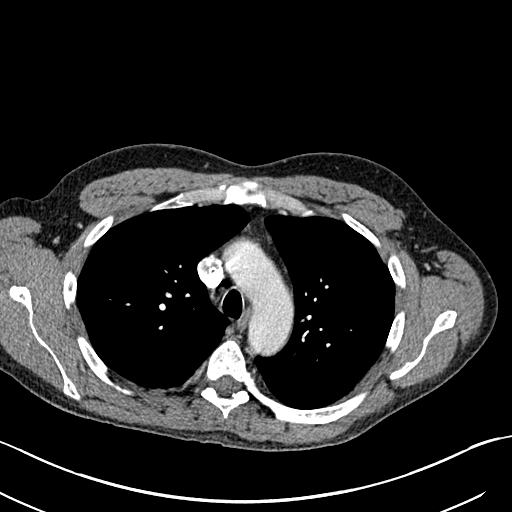
[im 129/184  lung]
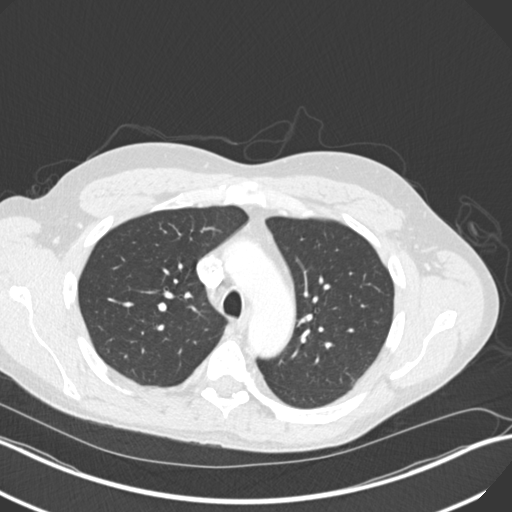
[im 143/184  lung]
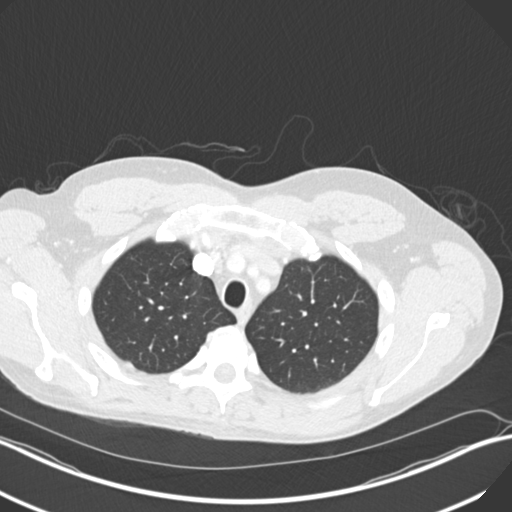
[im 156/184  lung]
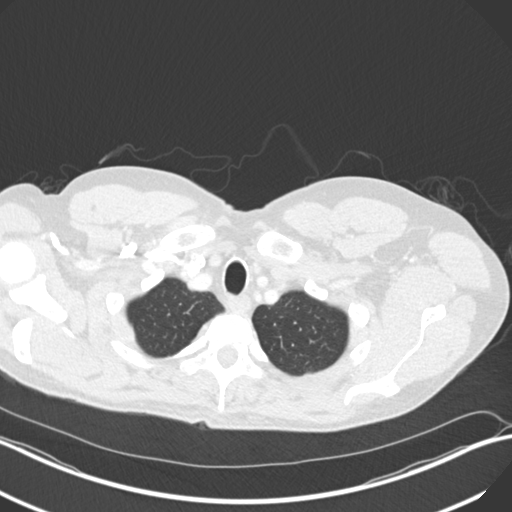
[im 170/184  lung]
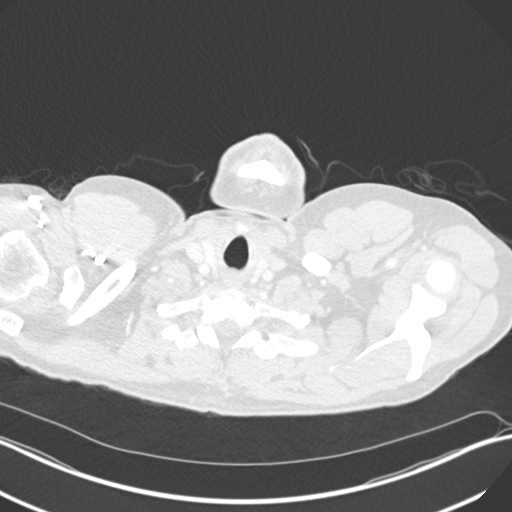

[Series 4: coronal · coronal · 0.75mm/px · 3 of 146 slices shown]
[im 30/146  lung]
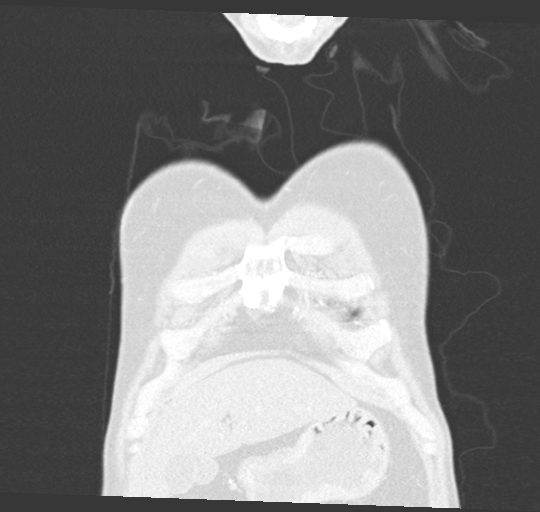
[im 59/146  lung]
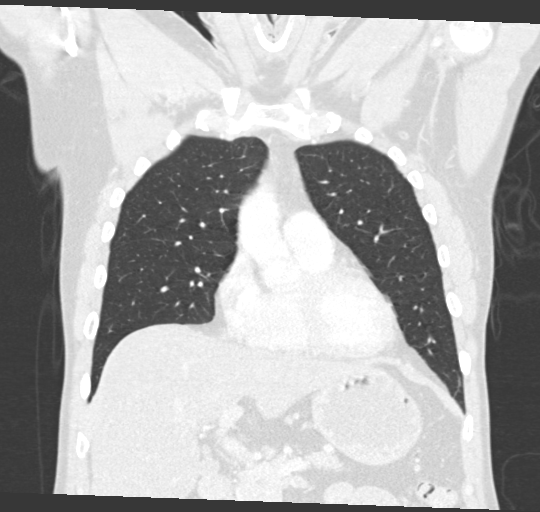
[im 88/146  lung]
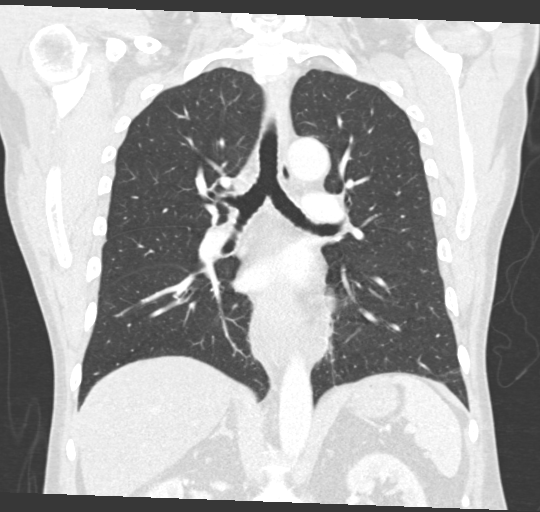

[15 of 36 positions shown; findings below may reference images not displayed]

RADIATION DOSE REDUCTION: This exam was performed according to the
departmental dose-optimization program which includes automated
exposure control, adjustment of the mA and/or kV according to
patient size and/or use of iterative reconstruction technique.

CONTRAST:  75mL OMNIPAQUE IOHEXOL 300 MG/ML  SOLN
FINDINGS: Cardiovascular: No significant vascular findings. Normal heart size.
No pericardial effusion.

Mediastinum/Nodes: The distal esophagus demonstrates a long segment
of circumferential mural thickening and edema with surrounding fat
stranding, likely representing esophagitis. There is a small hiatal
hernia. No pneumomediastinum. No enlarged mediastinal, hilar, or
axillary lymph nodes. Thyroid gland and trachea demonstrate no
significant findings.

Lungs/Pleura: There is minimal bibasilar atelectasis. No pleural
effusion or pneumothorax.

Upper Abdomen: The liver is diffusely hypoattenuating, consistent
with hepatic steatosis.

Musculoskeletal: No chest wall abnormality. No acute or significant
osseous findings.
IMPRESSION: Circumferential mural thickening and edema with surrounding fat
stranding of the distal esophagus likely represents esophagitis,
however malignancy cannot be excluded. No pneumomediastinum.

## 2023-09-16 IMAGING — DX DG CHEST 1V PORT
1 series · 1 of 1 positions shown · non-contrast
Comparison: 01/10/2017

CLINICAL DATA: Hematemesis.  Esophageal ulcer.

EXAM:
PORTABLE CHEST 1 VIEW

[chest ap]
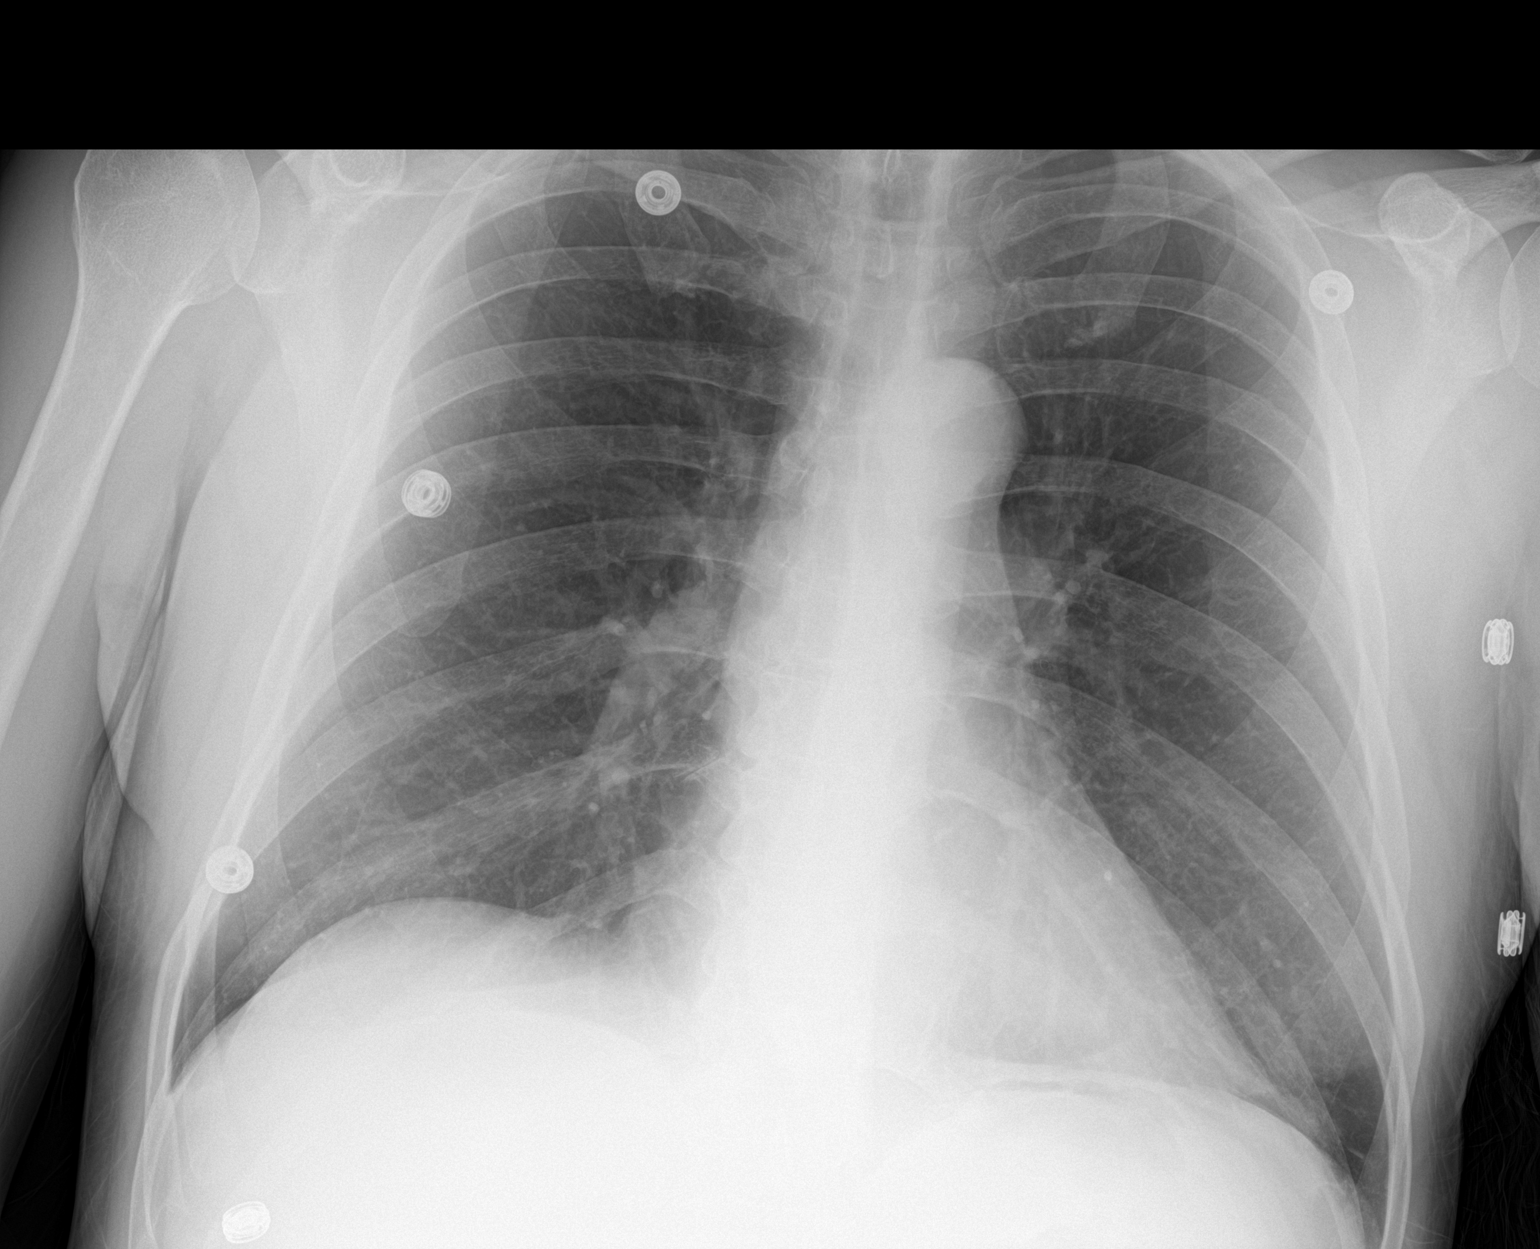

[1 of 1 positions shown; findings below may reference images not displayed]

FINDINGS: The heart size and mediastinal contours are within normal limits.
Both lungs are clear. No evidence of pneumomediastinum or
pneumothorax. No definite free intraperitoneal air identified along
the diaphragm.
IMPRESSION: No active disease.

## 2024-02-13 DIAGNOSIS — M545 Low back pain, unspecified: Secondary | ICD-10-CM | POA: Insufficient documentation

## 2024-02-13 DIAGNOSIS — F419 Anxiety disorder, unspecified: Secondary | ICD-10-CM | POA: Insufficient documentation

## 2024-02-13 DIAGNOSIS — F330 Major depressive disorder, recurrent, mild: Secondary | ICD-10-CM | POA: Insufficient documentation

## 2024-02-13 DIAGNOSIS — Z8719 Personal history of other diseases of the digestive system: Secondary | ICD-10-CM | POA: Insufficient documentation

## 2024-02-13 DIAGNOSIS — F840 Autistic disorder: Secondary | ICD-10-CM | POA: Insufficient documentation

## 2024-02-27 ENCOUNTER — Ambulatory Visit: Payer: MEDICAID | Admitting: Internal Medicine

## 2024-07-08 ENCOUNTER — Encounter: Payer: Self-pay | Admitting: Dermatology

## 2024-07-08 ENCOUNTER — Ambulatory Visit (INDEPENDENT_AMBULATORY_CARE_PROVIDER_SITE_OTHER): Payer: MEDICAID | Admitting: Dermatology

## 2024-07-08 VITALS — BP 139/89 | HR 88

## 2024-07-08 DIAGNOSIS — L700 Acne vulgaris: Secondary | ICD-10-CM

## 2024-07-08 DIAGNOSIS — L905 Scar conditions and fibrosis of skin: Secondary | ICD-10-CM

## 2024-07-08 DIAGNOSIS — K2210 Ulcer of esophagus without bleeding: Secondary | ICD-10-CM | POA: Insufficient documentation

## 2024-07-08 MED ORDER — CLINDAMYCIN PHOSPHATE 1 % EX SWAB: 1.0000 | Freq: Every morning | CUTANEOUS | 9 refills | Status: AC

## 2024-07-08 MED ORDER — TRETINOIN 0.1 % EX CREA: TOPICAL_CREAM | Freq: Every day | CUTANEOUS | 4 refills | Status: AC

## 2024-07-08 NOTE — Progress Notes (Signed)
 "  New Patient Visit   Subjective  Tyler Trevino is a 54 y.o. male who presents for the following: Acne  Patient states he  has acne located at the face, chest, and back that he  would like to have examined. Patient reports the areas have been there since teenager. She reports the areas are bothersome.He states the he gets deeper painful inflamed bumps. Patient rates irritation 6 out of 10. Patient reports he has previously been treated for these areas. He was previously on a course of Accutane in 1994 and he stayed clear until about 2014. He was then Treated with oral Doxycycline  and Retin A 0.05%. He stopped Doxycyline due to Esophageal ulcers. He is currently applying Retin- A 0.05% 7 nights weekly. He cleanses with Neutrogena cleansing wipes 2 times daily, he is unable to wash his face with soap and water due to living arrangements. He doen't have the set up to wash his face Acne moisturizer  Patient provided verbal consent for the use of an AI-assisted program to generate a detailed after-visit summary. The patient understands that the AI tool is used to support clinical documentation and that all information will be reviewed and verified by the healthcare provider.  The following portions of the chart were reviewed this encounter and updated as appropriate: medications, allergies, medical history  Review of Systems:  No other skin or systemic complaints except as noted in HPI or Assessment and Plan.  Objective  Well appearing patient in no apparent distress; mood and affect are within normal limits.  A focused examination was performed of the following areas: Face  Relevant exam findings are noted in the Assessment and Plan.         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 Previous Accutane treatment in 1994 was effective for approximately 20  years. Current regimen includes tretinoin  0.05% nightly, but tolerance has developed. GERD and esophageal ulcers contraindicate doxycycline . No current cystic lesions on chest and back, but scarring is present. Improvement expected with regimen adjustments.  Treatment Plan: - Prescribed Tretinoin  0.1% Apply topically at bedtime. Apply 1 night weekly for the first month, Apply 2 nights weekly for 1 month, If your skin tolerates increase to 3 nights weekly starting at 3 months. Decrease usage if you experience excessive dryness to 1-2 nights - Prescribed Clindamycin  Swabs to apply after Neutrogena cleansing swabs - Recommended washing with BPO Wash after working out PRN  Topical retinoid medications like tretinoin /Retin-A , adapalene/Differin, tazarotene/Fabior, and Epiduo/Epiduo Forte can cause dryness and irritation when first started. Only apply a pea-sized amount to the entire affected area. Avoid applying it around the eyes, edges of mouth and creases at the nose. If you experience irritation, use a good moisturizer first and/or apply the medicine less often. If you are doing well with the medicine, you can increase how often you use it until you are applying every night. Be careful with sun protection while using this medication as it can make you sensitive to the sun. This medicine should not be used by pregnant women.   CYSTIC ACNE VULGARIS   This Visit - tretinoin  (RETIN-A ) 0.1 % cream - Apply topically at bedtime. Apply topically at bedtime. Apply 1 night weekly for the first month, Apply 2 nights weekly for 1 month, If your skin tolerates increase to 3 nights weekly starting at 3 months. Decrease usage if you experience  excessive dryness to 1-2 nights - clindamycin  (CLEOCIN  T) 1 % SWAB - Apply 1 Application topically in the morning. After Washing face swab every morning  Return in about 4 months (around 11/05/2024) for Acne F/U.  I, Jetta Ager, am acting as neurosurgeon for [employer],  DO.  Documentation: I have reviewed the above documentation for accuracy and completeness, and I agree with the above.  Delon Lenis, DO     "

## 2024-07-08 NOTE — Patient Instructions (Addendum)
 VISIT SUMMARY:  You came in today to discuss the management of your cystic acne. We reviewed your history, including your previous treatment with Accutane and your current regimen. We discussed your concerns about the impact of your acne on your social life and your worries about medications affecting your GERD and esophageal ulcers.  YOUR PLAN:  -CYSTIC ACNE VULGARIS:  Cystic acne vulgaris is a severe form of acne characterized by large, painful cysts under the skin.   To help manage your acne, we are increasing your tretinoin  to 0.1% nightly, starting every other night to minimize dryness and irritation.   We also prescribed clindamycin  swabs for morning use to reduce bacteria and recommended using a benzoyl peroxide wash (sample of the CeraVe brand were provided) after gym sessions on weekends.   For morning cleansing, you can use Neutrogena or Cetaphil wipes. Additionally, we provided samples of Neutrogena Hydro Boost moisturizer for nighttime use.  INSTRUCTIONS:  Morning: -Wipe face down -Apply clindamycin  swab to face, chest and back -Apply Cetaphil Matte Oil Free Moisturizer  Evening: -Wash face in shower -Apply pea size amount of tretinoin  0.1% (start every other night for the first 3-4 weeks) -Apply more heavy duty moisturizer such as Neutrogena Hydroboost or CeraVe (samples provided)  Please schedule a follow-up appointment in early May to assess your progress and make any necessary adjustments to your treatment plan.    Important Information   Due to recent changes in healthcare laws, you may see results of your pathology and/or laboratory studies on MyChart before the doctors have had a chance to review them. We understand that in some cases there may be results that are confusing or concerning to you. Please understand that not all results are received at the same time and often the doctors may need to interpret multiple results in order to provide you with the best plan  of care or course of treatment. Therefore, we ask that you please give us  2 business days to thoroughly review all your results before contacting the office for clarification. Should we see a critical lab result, you will be contacted sooner.     If You Need Anything After Your Visit   If you have any questions or concerns for your doctor, please call our main line at [phone]. If no one answers, please leave a voicemail as directed and we will return your call as soon as possible. Messages left after 4 pm will be answered the following business day.    You may also send us  a message via MyChart. We typically respond to MyChart messages within 1-2 business days.  For prescription refills, please ask your pharmacy to contact our office. Our fax number is [phone].  If you have an urgent issue when the clinic is closed that cannot wait until the next business day, you can page your doctor at the number below.     Please note that while we do our best to be available for urgent issues outside of office hours, we are not available 24/7.    If you have an urgent issue and are unable to reach us , you may choose to seek medical care at your doctor's office, retail clinic, urgent care center, or emergency room.   If you have a medical emergency, please immediately call 911 or go to the emergency department. In the event of inclement weather, please call our main line at [phone] for an update on the status of any delays or closures.  Dermatology Medication Tips:  Please keep the boxes that topical medications come in in order to help keep track of the instructions about where and how to use these. Pharmacies typically print the medication instructions only on the boxes and not directly on the medication tubes.   If your medication is too expensive, please contact our office at [phone] or send us  a message through MyChart.    We are unable to tell what your co-pay for medications will  be in advance as this is different depending on your insurance coverage. However, we may be able to find a substitute medication at lower cost or fill out paperwork to get insurance to cover a needed medication.    If a prior authorization is required to get your medication covered by your insurance company, please allow us  1-2 business days to complete this process.   Drug prices often vary depending on where the prescription is filled and some pharmacies may offer cheaper prices.   The website www.goodrx.com contains coupons for medications through different pharmacies. The prices here do not account for what the cost may be with help from insurance (it may be cheaper with your insurance), but the website can give you the price if you did not use any insurance.  - You can print the associated coupon and take it with your prescription to the pharmacy.  - You may also stop by our office during regular business hours and pick up a GoodRx coupon card.  - If you need your prescription sent electronically to a different pharmacy, notify our office through [facility] or by phone at [phone]

## 2024-10-21 ENCOUNTER — Ambulatory Visit: Payer: MEDICAID | Admitting: Physician Assistant

## 2024-11-11 ENCOUNTER — Ambulatory Visit: Payer: MEDICAID | Admitting: Dermatology
